# Patient Record
Sex: Female | Born: 1935 | Race: Black or African American | Hispanic: No | Marital: Single | Smoking: Never smoker
Health system: Southern US, Community
[De-identification: ages and names within clinical notes are randomized; demographics above are authoritative.]

## PROBLEM LIST (undated history)

## (undated) DIAGNOSIS — F0390 Unspecified dementia without behavioral disturbance: Secondary | ICD-10-CM

## (undated) DIAGNOSIS — J449 Chronic obstructive pulmonary disease, unspecified: Secondary | ICD-10-CM

## (undated) DIAGNOSIS — N183 Chronic kidney disease, stage 3 unspecified: Secondary | ICD-10-CM

## (undated) DIAGNOSIS — Z85118 Personal history of other malignant neoplasm of bronchus and lung: Secondary | ICD-10-CM

## (undated) DIAGNOSIS — I272 Pulmonary hypertension, unspecified: Secondary | ICD-10-CM

## (undated) DIAGNOSIS — I10 Essential (primary) hypertension: Secondary | ICD-10-CM

## (undated) HISTORY — DX: Unspecified dementia, unspecified severity, without behavioral disturbance, psychotic disturbance, mood disturbance, and anxiety: F03.90

## (undated) HISTORY — DX: Chronic kidney disease, stage 3 (moderate): N18.3

## (undated) HISTORY — PX: LOBECTOMY: SHX5089

## (undated) HISTORY — DX: Pulmonary hypertension, unspecified: I27.20

## (undated) HISTORY — DX: Chronic kidney disease, stage 3 unspecified: N18.30

## (undated) HISTORY — PX: ABDOMINAL HYSTERECTOMY: SHX81

## (undated) HISTORY — DX: Personal history of other malignant neoplasm of bronchus and lung: Z85.118

## (undated) HISTORY — DX: Chronic obstructive pulmonary disease, unspecified: J44.9

## (undated) HISTORY — DX: Essential (primary) hypertension: I10

---

## 2011-04-03 ENCOUNTER — Ambulatory Visit: Payer: Self-pay | Admitting: Oncology

## 2011-04-17 ENCOUNTER — Ambulatory Visit: Payer: Self-pay | Admitting: Oncology

## 2011-05-18 ENCOUNTER — Ambulatory Visit: Payer: Self-pay | Admitting: Oncology

## 2011-05-24 ENCOUNTER — Ambulatory Visit: Payer: Self-pay | Admitting: Cardiothoracic Surgery

## 2011-06-01 ENCOUNTER — Ambulatory Visit: Payer: Self-pay | Admitting: Oncology

## 2011-06-06 ENCOUNTER — Ambulatory Visit: Payer: Self-pay

## 2011-06-14 ENCOUNTER — Inpatient Hospital Stay: Payer: Self-pay

## 2011-06-14 DIAGNOSIS — R9431 Abnormal electrocardiogram [ECG] [EKG]: Secondary | ICD-10-CM

## 2011-06-17 ENCOUNTER — Ambulatory Visit: Payer: Self-pay | Admitting: Oncology

## 2011-06-17 ENCOUNTER — Ambulatory Visit: Payer: Self-pay | Admitting: Internal Medicine

## 2011-06-22 LAB — PATHOLOGY REPORT

## 2011-07-06 ENCOUNTER — Ambulatory Visit: Payer: Self-pay | Admitting: Oncology

## 2011-07-18 ENCOUNTER — Ambulatory Visit: Payer: Self-pay | Admitting: Oncology

## 2011-07-18 ENCOUNTER — Ambulatory Visit: Payer: Self-pay | Admitting: Internal Medicine

## 2011-08-18 ENCOUNTER — Ambulatory Visit: Payer: Self-pay | Admitting: Oncology

## 2011-10-11 ENCOUNTER — Ambulatory Visit: Payer: Self-pay | Admitting: Cardiothoracic Surgery

## 2011-10-19 ENCOUNTER — Ambulatory Visit: Payer: Self-pay | Admitting: Oncology

## 2011-11-15 ENCOUNTER — Ambulatory Visit: Payer: Self-pay | Admitting: Oncology

## 2012-03-07 ENCOUNTER — Ambulatory Visit: Payer: Self-pay | Admitting: Cardiothoracic Surgery

## 2012-03-07 ENCOUNTER — Ambulatory Visit: Payer: Self-pay | Admitting: Oncology

## 2012-03-08 ENCOUNTER — Ambulatory Visit: Payer: Self-pay | Admitting: Oncology

## 2012-06-30 IMAGING — CR DG CHEST 1V PORT
1 series · 1 of 1 positions shown · non-contrast
Comparison: none

REASON FOR EXAM: Assess for Pleural Effusion
COMMENTS:

PROCEDURE:     DXR - DXR PORTABLE CHEST SINGLE VIEW  - June 17, 2011  [DATE]
RESULT:     Comparison: 06/16/2011

[portable]
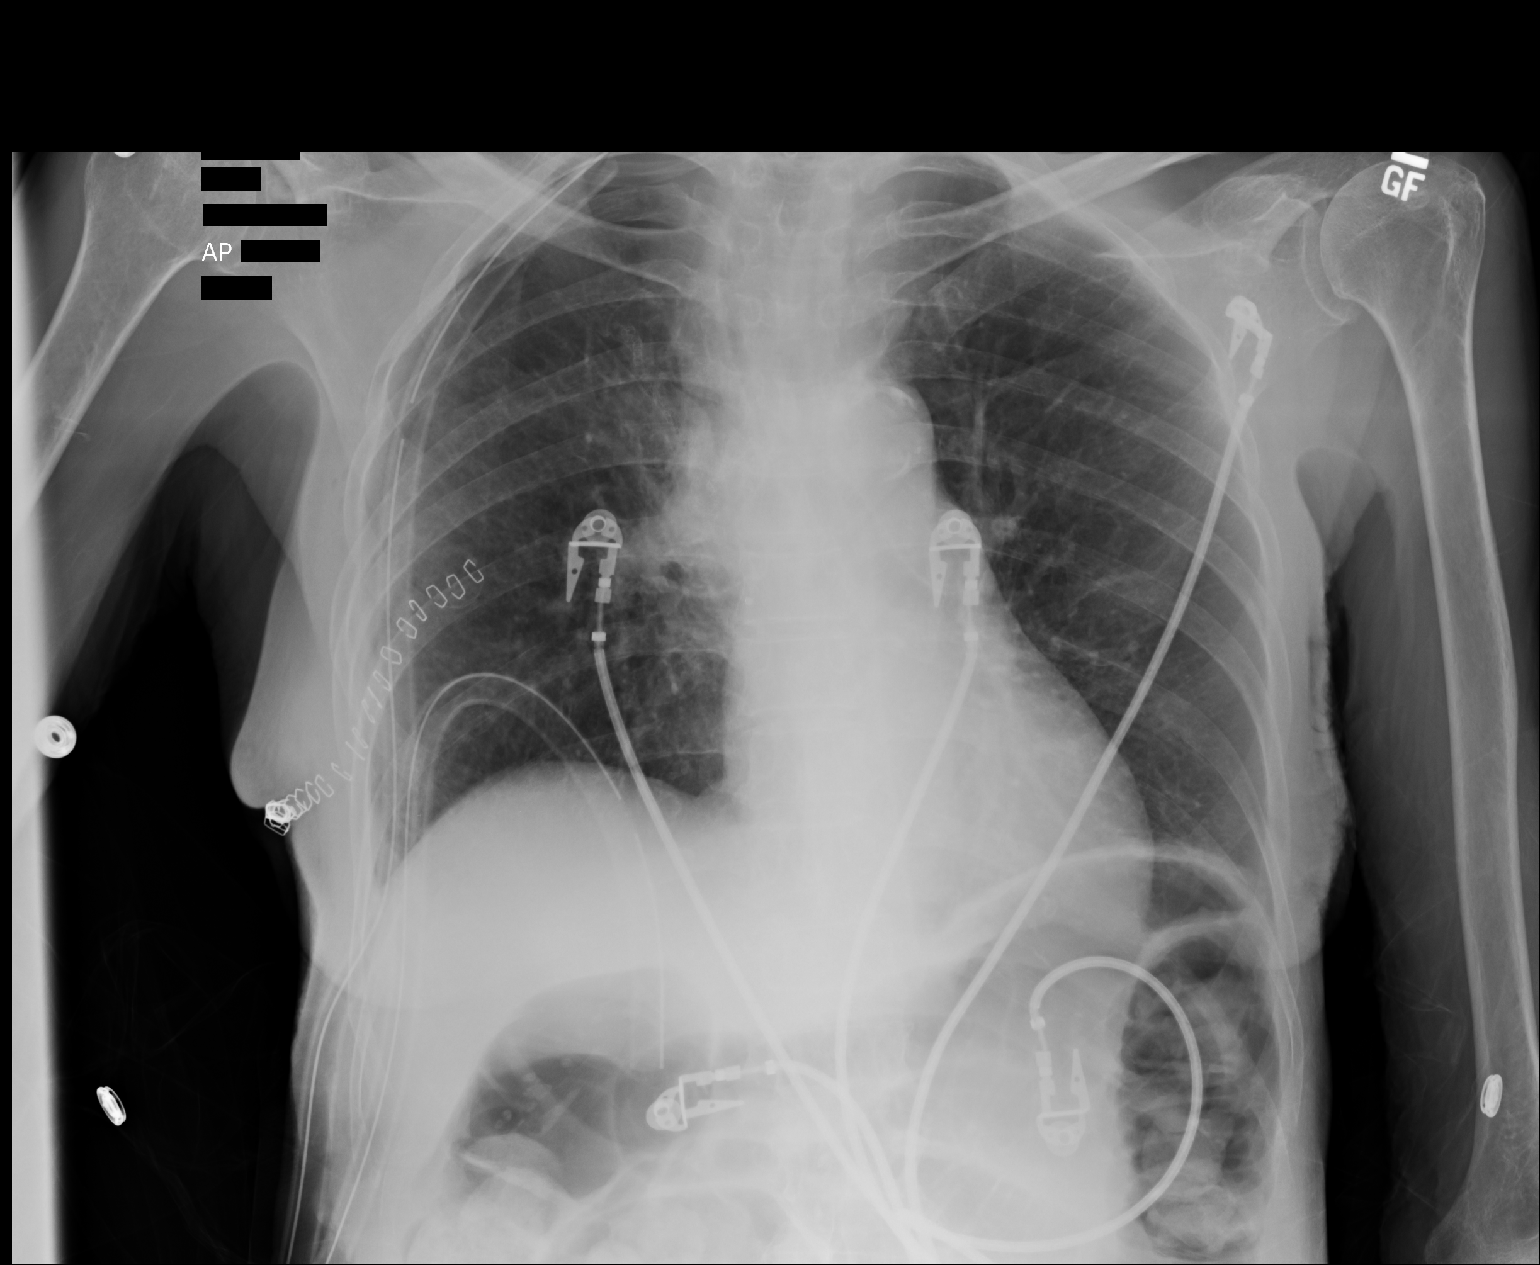

[1 of 1 positions shown; findings below may reference images not displayed]

FINDINGS: The heart and mediastinum are stable. There are 2 right chest tubes. Small
right pneumothorax is similar to prior. No definite effusion on this AP
view. No focal pulmonary opacities. Right perihilar opacities are unchanged,
possibly postoperative.
IMPRESSION: Unchanged small right pneumothorax. No definite effusion on this AP view.

## 2012-07-02 IMAGING — CR DG CHEST 1V PORT
1 series · 1 of 1 positions shown · non-contrast
Comparison: none

REASON FOR EXAM: assess for pleural effusion
COMMENTS:

[portable]
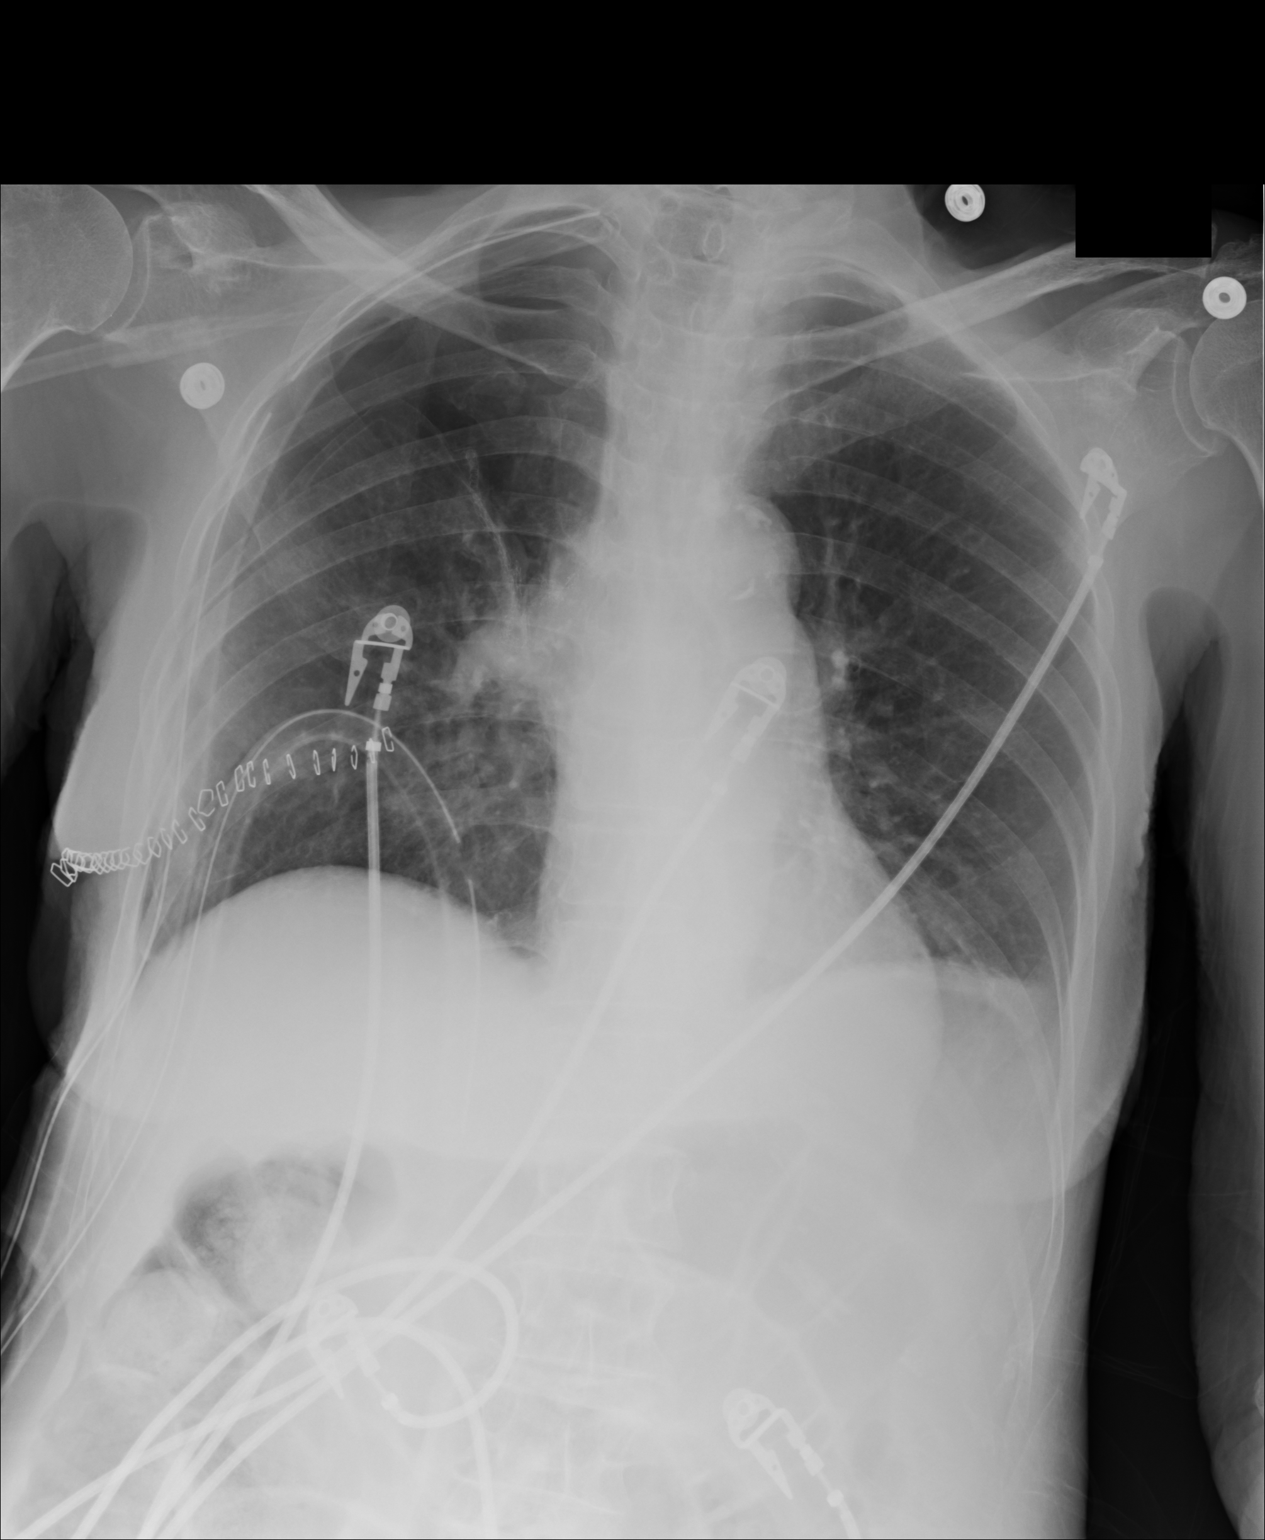

[1 of 1 positions shown; findings below may reference images not displayed]

PROCEDURE:     DXR - DXR PORTABLE CHEST SINGLE VIEW  - June 19, 2011  [DATE]

RESULT:     Comparison is made to the prior exam of 06/17/2011. Two right
chest tubes are present. The current exam shows a pneumothorax on the right
with there being approximately 2.9 cm separation of the visceral and
parietal pleura at the upper lobe. No pleural effusion is seen. The left
lung field is clear. Heart size is normal.
IMPRESSION: 1. Postoperative changes are noted on the right.
2. Two right chest tubes remain present.
3. The previously present small right pneumothorax has increased since the
prior exam.
4. No pleural effusion is seen.

## 2012-07-08 IMAGING — CR DG ABDOMEN 2V
1 series · 2 of 2 positions shown · non-contrast
Comparison: none

REASON FOR EXAM: constipation
COMMENTS:

PROCEDURE:     DXR - DXR ABDOMEN 2 V FLAT AND ERECT  - June 25, 2011  [DATE]
RESULT:
HISTORY: Constipation.
Comparison Study: Prior abdominal series of 06/20/2011.

[Series 1: erect ap · 0.17mm/px · 2 of 2 slices shown]
[im 1/2]
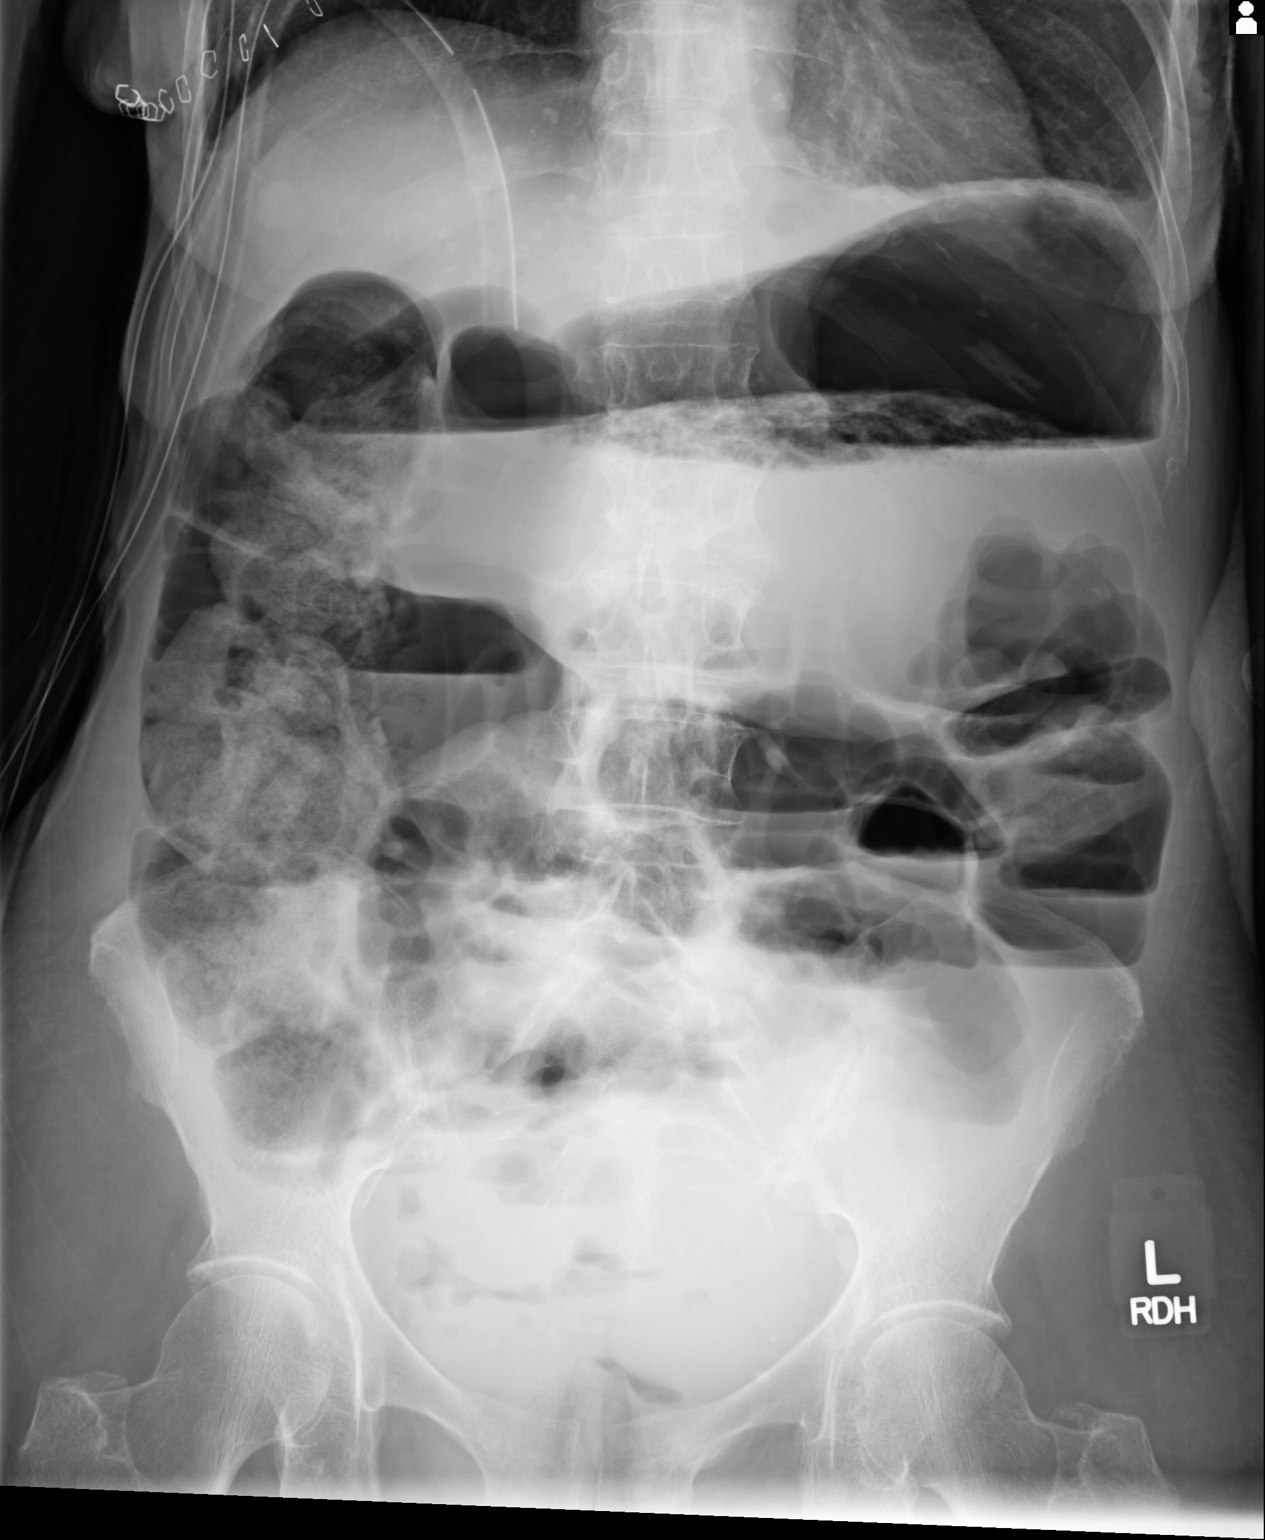
[im 2/2]
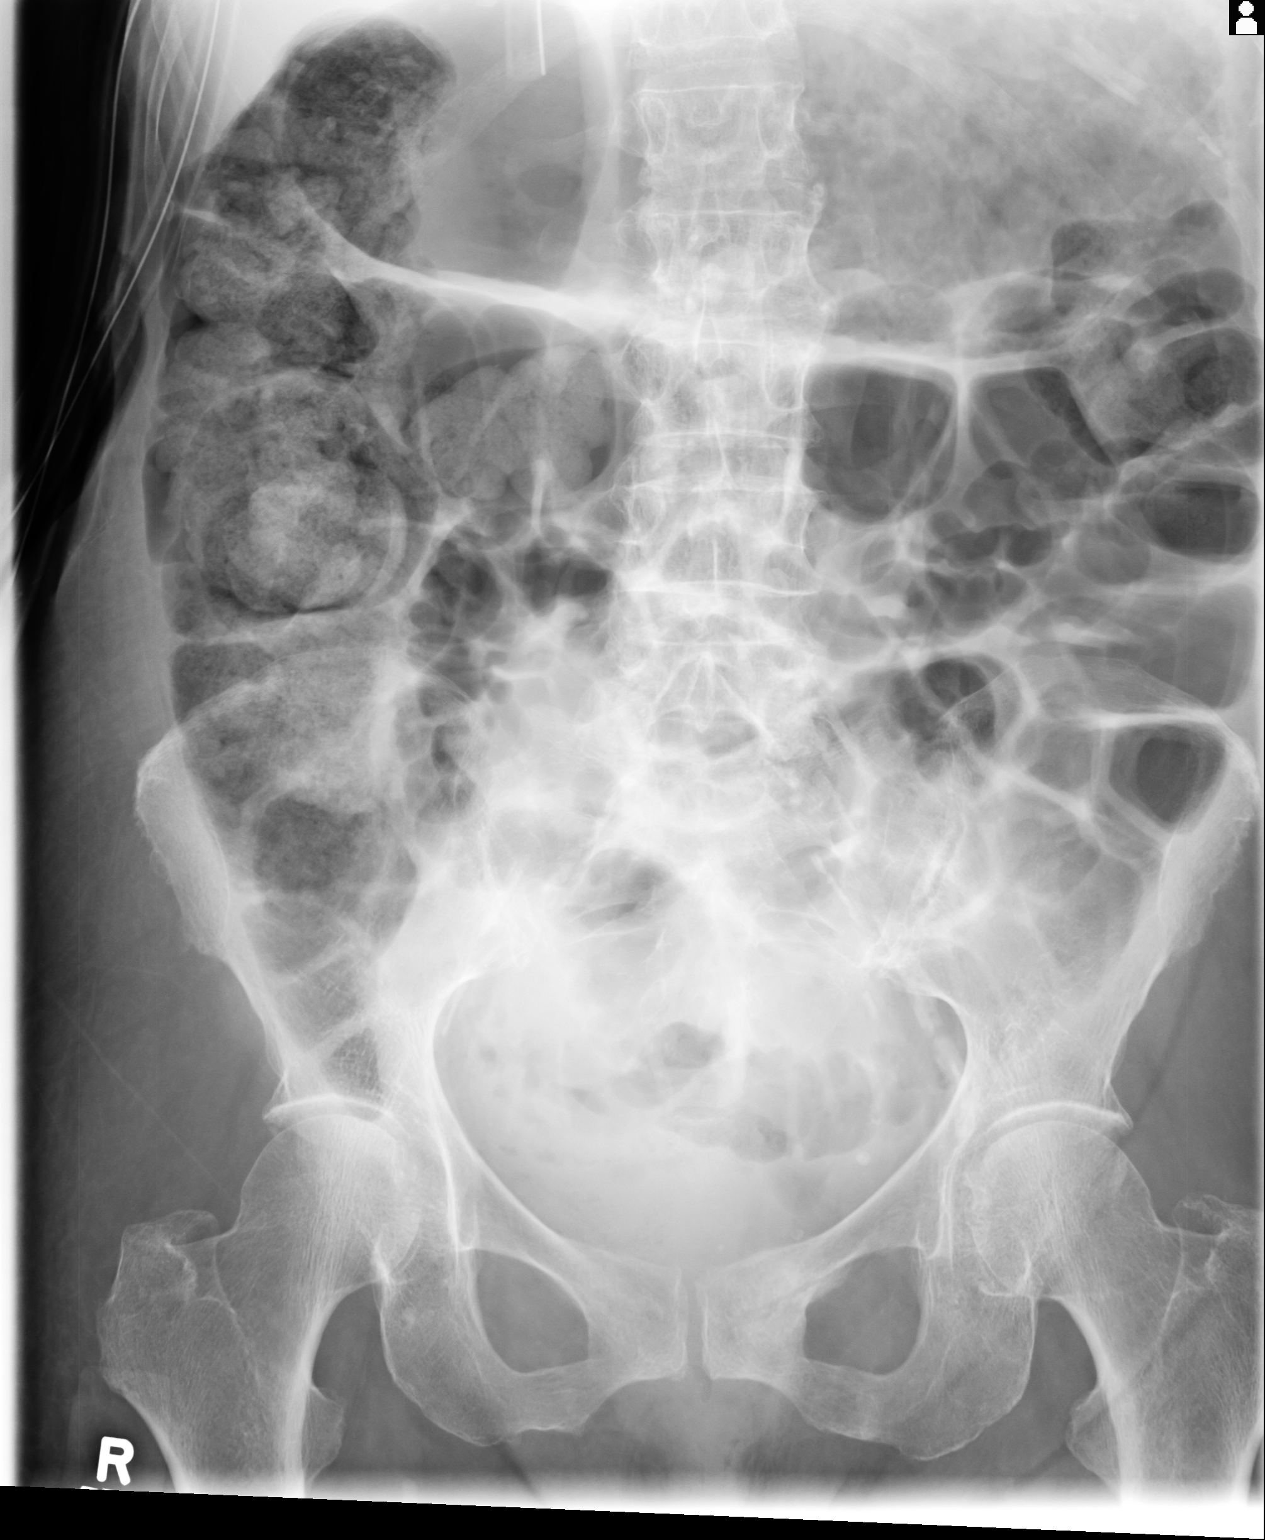

[2 of 2 positions shown; findings below may reference images not displayed]

FINDINGS: Air-filled dilated loops of large and small bowel are noted. Stool
is noted in the colon. These findings are stable and consistent with
adynamic ileus. Right chest tubes are noted. The lower chest tube is present
projected over the right lower lung. Clinical correlation is suggested.
Surgical staples are noted over the right chest.
IMPRESSION: Adynamic ileus. No change.

## 2012-07-16 IMAGING — CR DG CHEST 2V
1 series · 2 of 2 positions shown · non-contrast
Comparison: none

REASON FOR EXAM: CXT PA AND LAT LUNG CA
COMMENTS:

[Series 1: pa · 0.17mm/px · 2 of 2 slices shown]
[im 1/2]
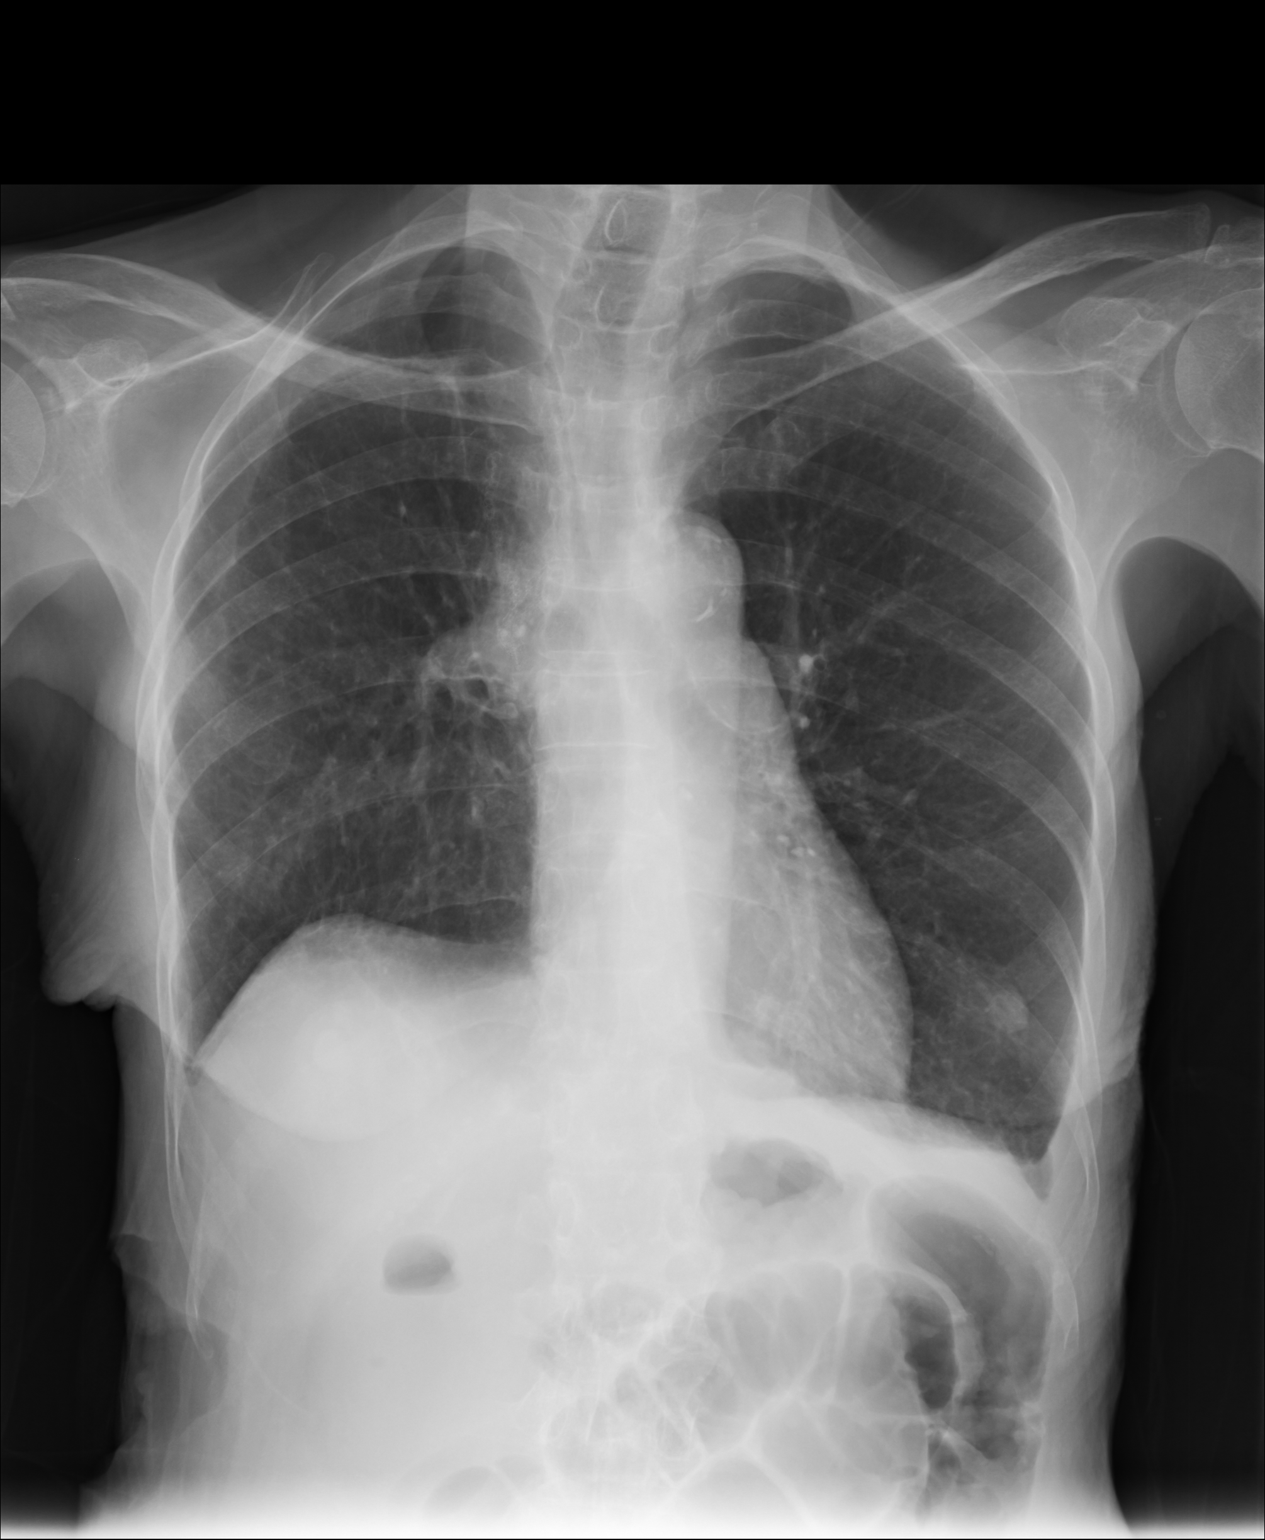
[im 2/2]
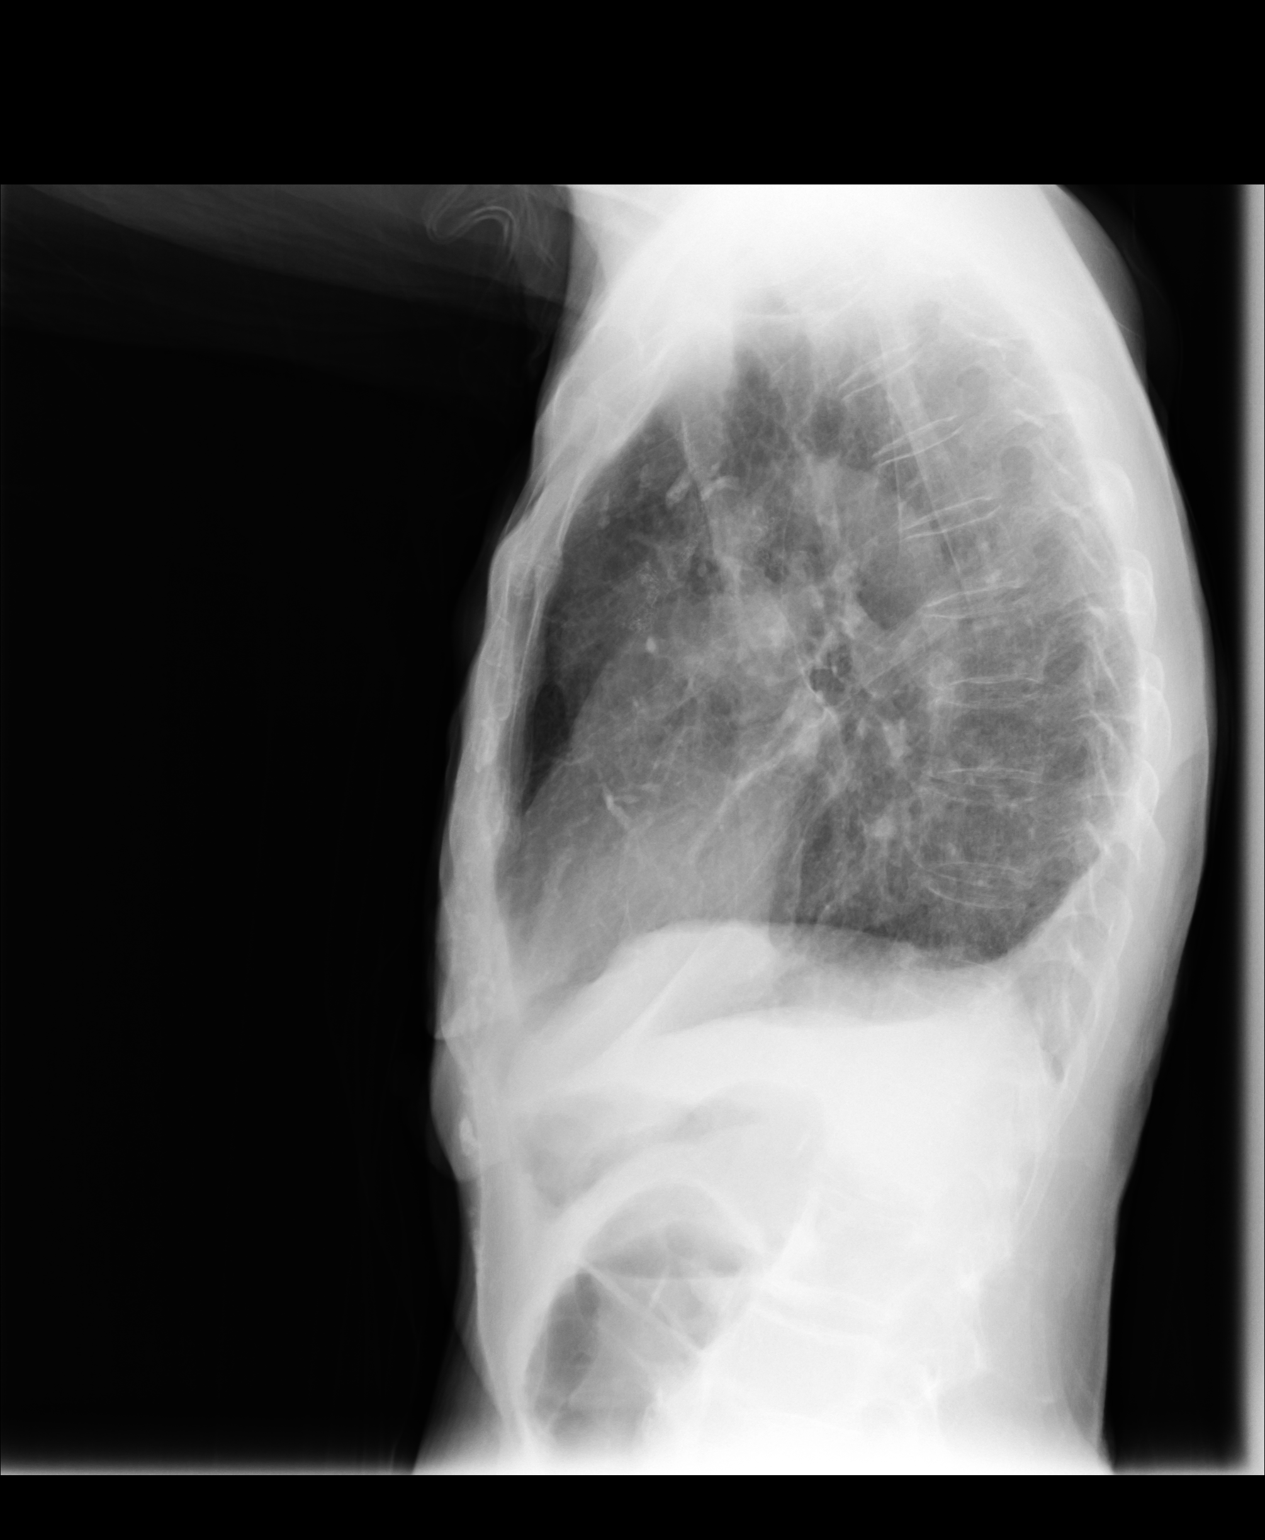

[2 of 2 positions shown; findings below may reference images not displayed]

PROCEDURE:     DXR - DXR CHEST PA (OR AP) AND LATERAL  - July 03, 2011 [DATE]

RESULT:

Comparison is made to the prior exam of 07/03/2011.

Post operative changes are again noted on the right. The previously present
chest tubes on the right have been removed. No pneumothorax is seen. There
is blunting of the left posterior gutter as visualized in the lateral view
and compatible with a small, left pleural effusion or with pleural fibrosis.
No new pulmonary infiltrates are seen. The heart size is normal.
IMPRESSION: 1.  In comparison with the prior exam of earlier this date, the previously
present two, right chest tubes have been removed. No other significant
interval change is seen.
2.  No pneumothorax is identified.

## 2012-08-17 ENCOUNTER — Ambulatory Visit: Payer: Self-pay | Admitting: Cardiothoracic Surgery

## 2012-09-14 ENCOUNTER — Ambulatory Visit: Payer: Self-pay | Admitting: Cardiothoracic Surgery

## 2013-02-28 ENCOUNTER — Ambulatory Visit: Payer: Self-pay | Admitting: Cardiothoracic Surgery

## 2013-03-06 ENCOUNTER — Ambulatory Visit: Payer: Self-pay | Admitting: Cardiothoracic Surgery

## 2013-03-17 ENCOUNTER — Ambulatory Visit: Payer: Self-pay | Admitting: Cardiothoracic Surgery

## 2013-09-03 ENCOUNTER — Ambulatory Visit: Payer: Self-pay | Admitting: Cardiothoracic Surgery

## 2013-09-24 ENCOUNTER — Ambulatory Visit: Payer: Self-pay | Admitting: Internal Medicine

## 2013-10-15 ENCOUNTER — Ambulatory Visit: Payer: Self-pay | Admitting: Internal Medicine

## 2014-06-25 ENCOUNTER — Ambulatory Visit: Payer: Self-pay | Admitting: Cardiothoracic Surgery

## 2014-07-23 ENCOUNTER — Ambulatory Visit: Payer: Self-pay | Admitting: Cardiothoracic Surgery

## 2016-12-21 ENCOUNTER — Inpatient Hospital Stay (HOSPITAL_COMMUNITY)
Admission: EM | Admit: 2016-12-21 | Discharge: 2016-12-25 | DRG: 683 | Disposition: A | Discharge status: Skilled Nursing Facility | Payer: Medicare Other | Attending: Internal Medicine | Admitting: Internal Medicine

## 2016-12-21 ENCOUNTER — Encounter (HOSPITAL_COMMUNITY): Payer: Self-pay | Admitting: Emergency Medicine

## 2016-12-21 ENCOUNTER — Emergency Department (HOSPITAL_COMMUNITY): Payer: Medicare Other

## 2016-12-21 DIAGNOSIS — R627 Adult failure to thrive: Secondary | ICD-10-CM | POA: Diagnosis present

## 2016-12-21 DIAGNOSIS — Z681 Body mass index (BMI) 19 or less, adult: Secondary | ICD-10-CM

## 2016-12-21 DIAGNOSIS — N183 Chronic kidney disease, stage 3 unspecified: Secondary | ICD-10-CM | POA: Diagnosis present

## 2016-12-21 DIAGNOSIS — E860 Dehydration: Secondary | ICD-10-CM | POA: Diagnosis present

## 2016-12-21 DIAGNOSIS — Z85118 Personal history of other malignant neoplasm of bronchus and lung: Secondary | ICD-10-CM

## 2016-12-21 DIAGNOSIS — F0280 Dementia in other diseases classified elsewhere without behavioral disturbance: Secondary | ICD-10-CM | POA: Diagnosis not present

## 2016-12-21 DIAGNOSIS — I129 Hypertensive chronic kidney disease with stage 1 through stage 4 chronic kidney disease, or unspecified chronic kidney disease: Secondary | ICD-10-CM | POA: Diagnosis present

## 2016-12-21 DIAGNOSIS — R000 Tachycardia, unspecified: Secondary | ICD-10-CM | POA: Diagnosis present

## 2016-12-21 DIAGNOSIS — R011 Cardiac murmur, unspecified: Secondary | ICD-10-CM | POA: Diagnosis present

## 2016-12-21 DIAGNOSIS — W19XXXA Unspecified fall, initial encounter: Secondary | ICD-10-CM | POA: Diagnosis present

## 2016-12-21 DIAGNOSIS — E440 Moderate protein-calorie malnutrition: Secondary | ICD-10-CM | POA: Insufficient documentation

## 2016-12-21 DIAGNOSIS — I272 Pulmonary hypertension, unspecified: Secondary | ICD-10-CM | POA: Diagnosis present

## 2016-12-21 DIAGNOSIS — F0390 Unspecified dementia without behavioral disturbance: Secondary | ICD-10-CM | POA: Diagnosis present

## 2016-12-21 DIAGNOSIS — R0602 Shortness of breath: Secondary | ICD-10-CM

## 2016-12-21 DIAGNOSIS — N179 Acute kidney failure, unspecified: Secondary | ICD-10-CM | POA: Diagnosis present

## 2016-12-21 DIAGNOSIS — I10 Essential (primary) hypertension: Secondary | ICD-10-CM | POA: Diagnosis not present

## 2016-12-21 DIAGNOSIS — Y92009 Unspecified place in unspecified non-institutional (private) residence as the place of occurrence of the external cause: Secondary | ICD-10-CM | POA: Diagnosis not present

## 2016-12-21 DIAGNOSIS — S32591A Other specified fracture of right pubis, initial encounter for closed fracture: Secondary | ICD-10-CM | POA: Diagnosis present

## 2016-12-21 DIAGNOSIS — Z79899 Other long term (current) drug therapy: Secondary | ICD-10-CM | POA: Diagnosis not present

## 2016-12-21 DIAGNOSIS — R600 Localized edema: Secondary | ICD-10-CM | POA: Diagnosis present

## 2016-12-21 DIAGNOSIS — G309 Alzheimer's disease, unspecified: Secondary | ICD-10-CM | POA: Diagnosis not present

## 2016-12-21 DIAGNOSIS — D649 Anemia, unspecified: Secondary | ICD-10-CM | POA: Diagnosis present

## 2016-12-21 DIAGNOSIS — I340 Nonrheumatic mitral (valve) insufficiency: Secondary | ICD-10-CM | POA: Diagnosis not present

## 2016-12-21 DIAGNOSIS — R609 Edema, unspecified: Secondary | ICD-10-CM

## 2016-12-21 DIAGNOSIS — M25559 Pain in unspecified hip: Secondary | ICD-10-CM

## 2016-12-21 LAB — URINALYSIS, ROUTINE W REFLEX MICROSCOPIC
BILIRUBIN URINE: NEGATIVE
Glucose, UA: 50 mg/dL — AB
Ketones, ur: 5 mg/dL — AB
Nitrite: NEGATIVE
Protein, ur: 100 mg/dL — AB
SPECIFIC GRAVITY, URINE: 1.013 (ref 1.005–1.030)
Squamous Epithelial / LPF: NONE SEEN
pH: 5 (ref 5.0–8.0)

## 2016-12-21 LAB — BASIC METABOLIC PANEL
Anion gap: 21 — ABNORMAL HIGH (ref 5–15)
BUN: 42 mg/dL — ABNORMAL HIGH (ref 6–20)
CHLORIDE: 97 mmol/L — AB (ref 101–111)
CO2: 16 mmol/L — ABNORMAL LOW (ref 22–32)
CREATININE: 3.12 mg/dL — AB (ref 0.44–1.00)
Calcium: 9.5 mg/dL (ref 8.9–10.3)
GFR calc Af Amer: 15 mL/min — ABNORMAL LOW (ref >60)
GFR, EST NON AFRICAN AMERICAN: 13 mL/min — AB (ref >60)
Glucose, Bld: 147 mg/dL — ABNORMAL HIGH (ref 65–99)
Potassium: 4.1 mmol/L (ref 3.5–5.1)
Sodium: 134 mmol/L — ABNORMAL LOW (ref 135–145)

## 2016-12-21 LAB — CBC WITH DIFFERENTIAL/PLATELET
BASOS PCT: 0 %
Basophils Absolute: 0 10*3/uL (ref 0.0–0.1)
EOS ABS: 0 10*3/uL (ref 0.0–0.7)
Eosinophils Relative: 0 %
HCT: 29.5 % — ABNORMAL LOW (ref 36.0–46.0)
HEMOGLOBIN: 10 g/dL — AB (ref 12.0–15.0)
Lymphocytes Relative: 4 %
Lymphs Abs: 0.6 10*3/uL — ABNORMAL LOW (ref 0.7–4.0)
MCH: 27.9 pg (ref 26.0–34.0)
MCHC: 33.9 g/dL (ref 30.0–36.0)
MCV: 82.2 fL (ref 78.0–100.0)
Monocytes Absolute: 0.8 10*3/uL (ref 0.1–1.0)
Monocytes Relative: 6 %
NEUTROS PCT: 90 %
Neutro Abs: 12 10*3/uL — ABNORMAL HIGH (ref 1.7–7.7)
PLATELETS: 314 10*3/uL (ref 150–400)
RBC: 3.59 MIL/uL — AB (ref 3.87–5.11)
RDW: 17.2 % — ABNORMAL HIGH (ref 11.5–15.5)
WBC: 13.3 10*3/uL — AB (ref 4.0–10.5)

## 2016-12-21 LAB — BRAIN NATRIURETIC PEPTIDE: B Natriuretic Peptide: 113 pg/mL — ABNORMAL HIGH (ref 0.0–100.0)

## 2016-12-21 LAB — TSH: TSH: 0.912 u[IU]/mL (ref 0.350–4.500)

## 2016-12-21 MED ORDER — HEPARIN SODIUM (PORCINE) 5000 UNIT/ML IJ SOLN
5000.0000 [IU] | Freq: Three times a day (TID) | INTRAMUSCULAR | Status: DC
  Administered 2016-12-21 – 2016-12-25 (×9): 5000 [IU] via SUBCUTANEOUS
  Filled 2016-12-21 (×9): qty 1

## 2016-12-21 MED ORDER — ONDANSETRON HCL 4 MG PO TABS: 4.0000 mg | ORAL_TABLET | Freq: Four times a day (QID) | ORAL | Status: DC | PRN

## 2016-12-21 MED ORDER — SODIUM CHLORIDE 0.9 % IV SOLN
INTRAVENOUS | Status: DC
  Administered 2016-12-21 – 2016-12-25 (×8): via INTRAVENOUS

## 2016-12-21 MED ORDER — ASPIRIN EC 81 MG PO TBEC
81.0000 mg | DELAYED_RELEASE_TABLET | Freq: Every day | ORAL | Status: DC
  Administered 2016-12-21 – 2016-12-25 (×5): 81 mg via ORAL
  Filled 2016-12-21 (×5): qty 1

## 2016-12-21 MED ORDER — ENSURE ENLIVE PO LIQD
237.0000 mL | Freq: Two times a day (BID) | ORAL | Status: DC
  Administered 2016-12-22 – 2016-12-25 (×7): 237 mL via ORAL

## 2016-12-21 MED ORDER — ONDANSETRON HCL 4 MG/2ML IJ SOLN: 4.0000 mg | Freq: Four times a day (QID) | INTRAMUSCULAR | Status: DC | PRN

## 2016-12-21 MED ORDER — SODIUM CHLORIDE 0.9 % IV BOLUS (SEPSIS)
1000.0000 mL | Freq: Once | INTRAVENOUS | Status: AC
  Administered 2016-12-21: 1000 mL via INTRAVENOUS

## 2016-12-21 NOTE — Progress Notes (Signed)
CMT notified me that the patient had a run of SVT with a HR of 140.  It is currently 110.  Notified Dr. Marin Comment via text.  The patient appeared to be stable and voiced no concerns at the time of the incident.

## 2016-12-21 NOTE — H&P (Signed)
History and Physical    Kimberly Vincent EGB:[id number] DOB: [birth year] DOA: 12/21/2016  PCP: [facility]  Patient coming from: Home.    Chief Complaint:  Weakness and swelling of the lower extremities.   HPI: Kimberly Vincent is an 83 y.o. female with hx of dementia (mild), CKD unclear baseline Cr, HTN on Norvasc, hx of lung cancer s/p resection, lives with daughter at home, brought to the ER after she fell.   She also had failure to thrive, and hadn't been eating or drinking as much.  She has some low back pain, but no palpitation, CP, SOB, fever or chills.  Evaluation in the ER  Showed elevated BUN/Cr to 42/3.12, CXR showed bilateral pleural effusions but no infiltrate or vascular congestion, and doppler of the lower extremities showed no DVT.  Hospitalist was asked to admit her for bilateral pedal edema, AKI on CKD likely from volume depletion, and failure to thrive, weakness and falling.    ED Course:  See above.  Rewiew of Systems:  Constitutional: Negative for malaise, fever and chills. No significant weight loss or weight gain Eyes: Negative for eye pain, redness and discharge, diplopia, visual changes, or flashes of light. ENMT: Negative for ear pain, hoarseness, nasal congestion, sinus pressure and sore throat. No headaches; tinnitus, drooling, or problem swallowing. Cardiovascular: Negative for chest pain, palpitations, diaphoresis, dyspnea and peripheral edema. ; No orthopnea, PND Respiratory: Negative for cough, hemoptysis, wheezing and stridor. No pleuritic chestpain. Gastrointestinal: Negative for diarrhea, constipation,  melena, blood in stool, hematemesis, jaundice and rectal bleeding.    Genitourinary: Negative for frequency, dysuria, incontinence,flank pain and hematuria; Musculoskeletal: Negative for back pain and neck pain. Negative for swelling and trauma.;  Skin: . Negative for pruritus, rash, abrasions, bruising and skin lesion.; ulcerations Neuro:  Negative for headache, lightheadedness and neck stiffness. Negative for  altered level of consciousness , altered mental status,  burning feet, involuntary movement, seizure and syncope.  Psych: negative for  insomnia, tearfulness, panic attacks, hallucinations, paranoia, suicidal or homicidal ideation    Past Medical History:  Diagnosis Date  . Chronic kidney disease   . Hypertension   . Lung cancer [facility])       Past Surgical History:  Procedure Laterality Date  . ABDOMINAL HYSTERECTOMY    . LOBECTOMY       reports that she has never smoked. She has never used smokeless tobacco. She reports that she does not drink alcohol or use drugs.  No Known Allergies  History reviewed. No pertinent family history.   Prior to Admission medications   Medication Sig Start Date End Date Taking? Authorizing Provider  amLODipine (NORVASC) 10 MG tablet Take 1 tablet by mouth daily. 11/16/16  Yes [provider]  niacin (NIASPAN) 750 MG CR tablet Take 1 tablet by mouth at bedtime. 09/27/16  Yes [provider]    Physical Exam: Vitals:   12/21/16 0928 12/21/16 1200 12/21/16 1201 12/21/16 1428  BP: (!) 138/49 (!) 143/69    Pulse: (!) 105  (!) 112 (!) 107  Resp: (!) 22   18  Temp:      TempSrc:      SpO2: 99%  100% 100%  Weight:      Height:          Constitutional: NAD, calm, comfortable Vitals:   12/21/16 0928 12/21/16 1200 12/21/16 1201 12/21/16 1428  BP: (!) 138/49 (!) 143/69    Pulse: (!) 105  (!) 112 (!) 107  Resp: (!) 22   18  Temp:      TempSrc:      SpO2: 99%  100% 100%  Weight:      Height:       Eyes: PERRL, lids and conjunctivae normal ENMT: Mucous membranes are moist. Posterior pharynx clear of any exudate or lesions.Normal dentition.  Neck: normal, supple, no masses, no thyromegaly Respiratory: clear to auscultation bilaterally, no wheezing, no crackles. Normal respiratory effort. No accessory muscle use.  Cardiovascular: Regular rate and rhythm,  no murmurs / rubs / gallops. No extremity edema. 2+ pedal pulses. No carotid bruits.  Abdomen: no tenderness, no masses palpated. No hepatosplenomegaly. Bowel sounds positive.  Musculoskeletal: no clubbing / cyanosis. No joint deformity upper and lower extremities. Good ROM, no contractures. Normal muscle tone. 1-2 + edema.  Skin: no rashes, lesions, ulcers. No induration Neurologic: CN 2-12 grossly intact. Sensation intact, DTR normal. Strength 5/5 in all 4.  Psychiatric: Normal judgment and insight. Alert and oriented x 3. Normal mood.   Labs on Admission: I have personally reviewed following labs and imaging studies CBC:  Recent Labs Lab 12/21/16 1026  WBC 13.3*  NEUTROABS 12.0*  HGB 10.0*  HCT 29.5*  MCV 82.2  PLT 867   Basic Metabolic Panel:  Recent Labs Lab 12/21/16 1026  NA 134*  K 4.1  CL 97*  CO2 16*  GLUCOSE 147*  BUN 42*  CREATININE 3.12*  CALCIUM 9.5   Urine analysis:    Component Value Date/Time   COLORURINE YELLOW 12/21/2016 1355   APPEARANCEUR CLOUDY (A) 12/21/2016 1355   LABSPEC 1.013 12/21/2016 1355   PHURINE 5.0 12/21/2016 1355   GLUCOSEU 50 (A) 12/21/2016 1355   HGBUR MODERATE (A) 12/21/2016 1355   BILIRUBINUR NEGATIVE 12/21/2016 1355   KETONESUR 5 (A) 12/21/2016 1355   PROTEINUR 100 (A) 12/21/2016 1355   NITRITE NEGATIVE 12/21/2016 1355   LEUKOCYTESUR SMALL (A) 12/21/2016 1355   Radiological Exams on Admission: Dg Chest 1 View  Result Date: 12/21/2016 CLINICAL DATA:  Shortness of breath. EXAM: CHEST 1 VIEW COMPARISON:  06/25/2014 FINDINGS: Small pleural effusions. Chronic scar-like opacity over the right base. Status post right upper lobectomy. No air bronchogram or interstitial coarsening. Normal heart size. Stable mediastinal contours with postoperative distortion on the right. Prominent atherosclerotic calcification. IMPRESSION: 1. Small bilateral pleural effusion.  No visible edema or pneumonia. 2. COPD and right upper lobectomy.  Electronically Signed   By: Monte Fantasia M.D.   On: 12/21/2016 10:01   US Venous Img Lower Bilateral  Result Date: 12/21/2016 CLINICAL DATA:  Bilateral lower extremity edema EXAM: BILATERAL LOWER EXTREMITY VENOUS DUPLEX ULTRASOUND TECHNIQUE: Gray-scale sonography with graded compression, as well as color Doppler and duplex ultrasound were performed to evaluate the lower extremity deep venous systems from the level of the common femoral vein and including the common femoral, femoral, profunda femoral, popliteal and calf veins including the posterior tibial, peroneal and gastrocnemius veins when visible. The superficial great saphenous vein was also interrogated. Spectral Doppler was utilized to evaluate flow at rest and with distal augmentation maneuvers in the common femoral, femoral and popliteal veins. COMPARISON:  None. FINDINGS: RIGHT LOWER EXTREMITY Common Femoral Vein: No evidence of thrombus. Normal compressibility, respiratory phasicity and response to augmentation. Saphenofemoral Junction: No evidence of thrombus. Normal compressibility and flow on color Doppler imaging. Profunda Femoral Vein: No evidence of thrombus. Normal compressibility and flow on color Doppler imaging. Femoral Vein: No evidence of thrombus. Normal compressibility, respiratory phasicity and response to  augmentation. Popliteal Vein: No evidence of thrombus. Normal compressibility, respiratory phasicity and response to augmentation. Calf Veins: No evidence of thrombus. Normal compressibility and flow on color Doppler imaging. Superficial Great Saphenous Vein: No evidence of thrombus. Normal compressibility and flow on color Doppler imaging. Venous Reflux:  None. Other Findings:  None. LEFT LOWER EXTREMITY Common Femoral Vein: No evidence of thrombus. Normal compressibility, respiratory phasicity and response to augmentation. Saphenofemoral Junction: No evidence of thrombus. Normal compressibility and flow on color Doppler imaging.  Profunda Femoral Vein: No evidence of thrombus. Normal compressibility and flow on color Doppler imaging. Femoral Vein: No evidence of thrombus. Normal compressibility, respiratory phasicity and response to augmentation. Popliteal Vein: No evidence of thrombus. Normal compressibility, respiratory phasicity and response to augmentation. Calf Veins: No evidence of thrombus. Normal compressibility and flow on color Doppler imaging. Superficial Great Saphenous Vein: No evidence of thrombus. Normal compressibility and flow on color Doppler imaging. Venous Reflux:  None. Other Findings:  There is soft tissue edema bilaterally. IMPRESSION: No evidence of deep venous thrombosis in either lower extremity. There is soft tissue edema in both lower extremities. Electronically Signed   By: Lowella Grip III M.D.   On: 12/21/2016 13:27    EKG: Independently reviewed.   Assessment/Plan Active Problems:   Failure to thrive in adult   AKI (acute kidney injury) ([place])   Pedal edema   HTN (hypertension)   Dementia   CKD (chronic kidney disease), stage III   Tachycardia   Fall at home, initial encounter   PLAN:   AKI on CKD:  She is clinically volume depleted.  Will give IVF.  Check UA.  Will obtain renal US.  Follow Cr carefully.   Pedal edema:  I don't think she has volume overload.  No DVT.  Suspect dependent edema, aggravated with Norvasc.  Will d/c Norvasc.  If BP is up, consider Lopressor.   Dementia:  Very mild.  She can still make informed decision in my opinion.  HTN:  Stable.  WIll follow.  Change norvasc to Lopressor to avoid edema.  Tachycardia:  Unclear if pain, anxiety, or volume depletion.  Will follow.  Check TSH and check ECHO.  Heart murmur:  Obtain ECHO.   DVT prophylaxis:  subQ heparin.  Code Status: FULL CODE.  Family Communication: daughter and friend at bedside.  Disposition Plan: likely to home.  Consults called: None.  Admission status: inpatient.    Hana Trippett MD  FACP. [facility]  If 7PM-7AM, please contact night-coverage [email or web address] Password [email or web address]  12/21/2016, 3:12 PM

## 2016-12-21 NOTE — ED Provider Notes (Signed)
[facility] DEPT Provider Note   CSN: [id number] Arrival date & time: 12/21/16  0913     History   Chief Complaint Chief Complaint  Patient presents with  . Leg Swelling    HPI Kimberly Vincent is a 83 y.o. female.  HPI  The patient is a 83 year old female, she has a known history of lung cancer, chronic kidney disease and hypertension, she is currently taking Norvasc and niacin. She presents to the hospital with her family member who reports that she has had increased swelling of her legs, increased swelling of her left arm, has been having some confusion chronically and lives by herself. There is no comment about when she was most recently seen or about recent results.  There has been no reported fevers diarrhea or dysuria, no rashes, no abdominal pain chest pain coughing or shortness of breath. There is a report of decreased oral intake chronically. The patient states she just doesn't have any appetite.  The family member does report some chronic mild dementia which is gradually worsening  Past Medical History:  Diagnosis Date  . Chronic kidney disease   . Hypertension   . Lung cancer ([place])     There are no active problems to display for this patient.   Past Surgical History:  Procedure Laterality Date  . ABDOMINAL HYSTERECTOMY    . LOBECTOMY      OB History    No data available       Home Medications    Prior to Admission medications   Not on File    Family History History reviewed. No pertinent family history.  Social History Social History  Substance Use Topics  . Smoking status: Never Smoker  . Smokeless tobacco: Never Used  . Alcohol use No     Allergies   Patient has no known allergies.   Review of Systems Review of Systems  Unable to perform ROS: Dementia     Physical Exam Updated Vital Signs BP (!) 143/69   Pulse (!) 112   Temp 97.7 F (36.5 C) (Oral)   Resp (!) 22   Ht 5\' 3"  (1.6 m)   Wt 37.2 kg (82 lb)   SpO2 100%   BMI  14.53 kg/m   Physical Exam  Constitutional: She appears well-developed and well-nourished. No distress.  HENT:  Head: Normocephalic and atraumatic.  Mouth/Throat: Oropharynx is clear and moist. No oropharyngeal exudate.  Eyes: Conjunctivae and EOM are normal. Pupils are equal, round, and reactive to light. Right eye exhibits no discharge. Left eye exhibits no discharge. No scleral icterus.  Neck: Normal range of motion. Neck supple. No JVD present. No thyromegaly present.  Cardiovascular: Regular rhythm, normal heart sounds and intact distal pulses.  Exam reveals no gallop and no friction rub.   No murmur heard. Tachycardic, normal pulses, no JVD, no murmurs  Pulmonary/Chest: Effort normal and breath sounds normal. No respiratory distress. She has no wheezes. She has no rales.  Abdominal: Soft. Bowel sounds are normal. She exhibits no distension and no mass. There is no tenderness.  Musculoskeletal: Normal range of motion. She exhibits edema ( 2+ pitting edema of the right lower extremity, 1+ of the left lower extremity, left upper extremity with 1+ edema at the hand but otherwise symmetrical arms.). She exhibits no tenderness.  Lymphadenopathy:    She has no cervical adenopathy.  Neurological: She is alert. Coordination normal.  Follows commands, generalized weakness, able to lift both arms, both legs and cranial nerves III through  XII are normal. Memory is somewhat lacking but able to answer occasional questions correctly  Skin: Skin is warm and dry. No rash noted. No erythema.  Psychiatric: She has a normal mood and affect. Her behavior is normal.  Nursing note and vitals reviewed.    ED Treatments / Results  Labs (all labs ordered are listed, but only abnormal results are displayed) Labs Reviewed  CBC WITH DIFFERENTIAL/PLATELET - Abnormal; Notable for the following:       Result Value   WBC 13.3 (*)    RBC 3.59 (*)    Hemoglobin 10.0 (*)    HCT 29.5 (*)    RDW 17.2 (*)     Neutro Abs 12.0 (*)    Lymphs Abs 0.6 (*)    All other components within normal limits  BASIC METABOLIC PANEL - Abnormal; Notable for the following:    Sodium 134 (*)    Chloride 97 (*)    CO2 16 (*)    Glucose, Bld 147 (*)    BUN 42 (*)    Creatinine, Ser 3.12 (*)    GFR calc non Af Amer 13 (*)    GFR calc Af Amer 15 (*)    Anion gap 21 (*)    All other components within normal limits  BRAIN NATRIURETIC PEPTIDE - Abnormal; Notable for the following:    B Natriuretic Peptide 113.0 (*)    All other components within normal limits  URINALYSIS, ROUTINE W REFLEX MICROSCOPIC    EKG  EKG Interpretation None       Radiology Dg Chest 1 View  Result Date: 12/21/2016 CLINICAL DATA:  Shortness of breath. EXAM: CHEST 1 VIEW COMPARISON:  06/25/2014 FINDINGS: Small pleural effusions. Chronic scar-like opacity over the right base. Status post right upper lobectomy. No air bronchogram or interstitial coarsening. Normal heart size. Stable mediastinal contours with postoperative distortion on the right. Prominent atherosclerotic calcification. IMPRESSION: 1. Small bilateral pleural effusion.  No visible edema or pneumonia. 2. COPD and right upper lobectomy. Electronically Signed   By: Monte Fantasia M.D.   On: 12/21/2016 10:01    Procedures Procedures (including critical care time)  Medications Ordered in ED Medications  sodium chloride 0.9 % bolus 1,000 mL (not administered)     Initial Impression / Assessment and Plan / ED Course  I have reviewed the triage vital signs and the nursing notes.  Pertinent labs & imaging results that were available during my care of the patient were reviewed by me and considered in my medical decision making (see chart for details).    The patient does have findings of COPD and a right upper lobectomy. There is no edema, there is no pneumonia, there is no pneumothorax, there are small bilateral effusions. Blood work shows that the patient has renal  failure with a creatinine of close to 3, leukocytosis of 13.3 and a hemoglobin of 10. CO2 is 16, electrolytes are otherwise normal or close to normal. BNP is normal at 113. We'll try to obtain past results from her family doctor's office. Norvasc could be a cause of the peripheral edema but due to the asymmetry we'll perform a lower extremity Doppler.  09/21/16, labs reviewed from clinic - has cr of 1.6 at that time, today the BUN and Cr have doubled In AKI - needs admission, fluids, in and out for urine eval for UTI Admit to hospitalist - paged  Final Clinical Impressions(s) / ED Diagnoses   Final diagnoses:  Swelling  Peripheral edema  AKI (acute kidney injury) ([place])  Dehydration    New Prescriptions New Prescriptions   No medications on file     Noemi Chapel, MD 12/21/16 1325

## 2016-12-21 NOTE — ED Triage Notes (Addendum)
Patient brought in by family with complaint of bilateral leg swelling, and lower back pain since yesterday. Patient states "I fell, I think it was yesterday. I just lost my footing. I'm not hurting anywhere from that."

## 2016-12-21 NOTE — ED Notes (Signed)
Pt has pain to rt hip upper leg area when rolling pt in bed.  Reported to hospitalist.

## 2016-12-22 ENCOUNTER — Inpatient Hospital Stay (HOSPITAL_COMMUNITY): Payer: Medicare Other

## 2016-12-22 DIAGNOSIS — I10 Essential (primary) hypertension: Secondary | ICD-10-CM

## 2016-12-22 DIAGNOSIS — F0280 Dementia in other diseases classified elsewhere without behavioral disturbance: Secondary | ICD-10-CM

## 2016-12-22 DIAGNOSIS — W19XXXA Unspecified fall, initial encounter: Secondary | ICD-10-CM

## 2016-12-22 DIAGNOSIS — Y92009 Unspecified place in unspecified non-institutional (private) residence as the place of occurrence of the external cause: Secondary | ICD-10-CM

## 2016-12-22 DIAGNOSIS — G309 Alzheimer's disease, unspecified: Secondary | ICD-10-CM

## 2016-12-22 DIAGNOSIS — I340 Nonrheumatic mitral (valve) insufficiency: Secondary | ICD-10-CM

## 2016-12-22 LAB — ECHOCARDIOGRAM COMPLETE
AO mean calculated velocity dopler: 161 cm/s
AV Area VTI index: 1.03 cm2/m2
AV Area VTI: 1.35 cm2
AV Area mean vel: 1.32 cm2
AV Mean grad: 13 mmHg
AV Peak grad: 24 mmHg
AV VEL mean LVOT/AV: 0.66
AV area mean vel ind: 0.99 cm2/m2
AV peak Index: 1.01
AV pk vel: 246 cm/s
AV vel: 1.38
Ao pk vel: 0.67 m/s
E decel time: 345 ms
E/e' ratio: 11.03
FS: 52 % — AB (ref 28–44)
Height: 63 in
IVS/LV PW RATIO, ED: 0.87
LA ID, A-P, ES: 31 mm
LA diam end sys: 31 mm
LA diam index: 2.31 cm/m2
LA vol A4C: 35.3 mL
LA vol index: 18.3 mL/m2
LA vol: 24.6 mL
LV E/e' medial: 11.03
LV E/e'average: 11.03
LV PW d: 11.2 mm — AB (ref 0.6–1.1)
LV dias vol index: 30 mL/m2
LV dias vol: 40 mL — AB (ref 46–106)
LV e' LATERAL: 8.7 cm/s
LV sys vol index: 8 mL/m2
LV sys vol: 11 mL — AB (ref 14–42)
LVOT SV: 62 mL
LVOT VTI: 30.7 cm
LVOT area: 2.01 cm2
LVOT diameter: 16 mm
LVOT peak VTI: 0.69 cm
LVOT peak grad rest: 11 mmHg
LVOT peak vel: 165 cm/s
Lateral S' vel: 19 cm/s
MV Dec: 345
MV Peak grad: 4 mmHg
MV pk A vel: 133 m/s
MV pk E vel: 96 m/s
RV sys press: 73 mmHg
Reg peak vel: 404 cm/s
Simpson's disk: 74
Stroke v: 30 mL
TAPSE: 22.6 mm
TDI e' lateral: 8.7
TDI e' medial: 7.72
TR max vel: 404 cm/s
VTI: 44.6 cm
Valve area index: 1.03
Valve area: 1.38 cm2
Weight: 1456.8 [oz_av]

## 2016-12-22 LAB — IRON AND TIBC
Iron: 24 ug/dL — ABNORMAL LOW (ref 28–170)
Saturation Ratios: 12 % (ref 10.4–31.8)
TIBC: 203 ug/dL — ABNORMAL LOW (ref 250–450)
UIBC: 179 ug/dL

## 2016-12-22 LAB — RETICULOCYTES
RBC.: 3.26 MIL/uL — AB (ref 3.87–5.11)
RETIC COUNT ABSOLUTE: 61.9 10*3/uL (ref 19.0–186.0)
Retic Ct Pct: 1.9 % (ref 0.4–3.1)

## 2016-12-22 LAB — COMPREHENSIVE METABOLIC PANEL
ALBUMIN: 2.9 g/dL — AB (ref 3.5–5.0)
ALT: 23 U/L (ref 14–54)
AST: 27 U/L (ref 15–41)
Alkaline Phosphatase: 79 U/L (ref 38–126)
Anion gap: 12 (ref 5–15)
BUN: 32 mg/dL — ABNORMAL HIGH (ref 6–20)
CHLORIDE: 104 mmol/L (ref 101–111)
CO2: 20 mmol/L — AB (ref 22–32)
CREATININE: 2.07 mg/dL — AB (ref 0.44–1.00)
Calcium: 8.3 mg/dL — ABNORMAL LOW (ref 8.9–10.3)
GFR calc Af Amer: 24 mL/min — ABNORMAL LOW (ref >60)
GFR calc non Af Amer: 21 mL/min — ABNORMAL LOW (ref >60)
Glucose, Bld: 92 mg/dL (ref 65–99)
Potassium: 3.5 mmol/L (ref 3.5–5.1)
SODIUM: 136 mmol/L (ref 135–145)
Total Bilirubin: 1.3 mg/dL — ABNORMAL HIGH (ref 0.3–1.2)
Total Protein: 5.7 g/dL — ABNORMAL LOW (ref 6.5–8.1)

## 2016-12-22 LAB — VITAMIN B12: Vitamin B-12: 1147 pg/mL — ABNORMAL HIGH (ref 180–914)

## 2016-12-22 LAB — FERRITIN: FERRITIN: 433 ng/mL — AB (ref 11–307)

## 2016-12-22 LAB — CBC
HCT: 25 % — ABNORMAL LOW (ref 36.0–46.0)
Hemoglobin: 8.3 g/dL — ABNORMAL LOW (ref 12.0–15.0)
MCH: 27.2 pg (ref 26.0–34.0)
MCHC: 33.2 g/dL (ref 30.0–36.0)
MCV: 82 fL (ref 78.0–100.0)
Platelets: 296 K/uL (ref 150–400)
RBC: 3.05 MIL/uL — ABNORMAL LOW (ref 3.87–5.11)
RDW: 17.4 % — ABNORMAL HIGH (ref 11.5–15.5)
WBC: 9.3 K/uL (ref 4.0–10.5)

## 2016-12-22 LAB — FOLATE: Folate: 4.6 ng/mL — ABNORMAL LOW (ref >5.9)

## 2016-12-22 MED ORDER — ADULT MULTIVITAMIN W/MINERALS CH
1.0000 | ORAL_TABLET | Freq: Every day | ORAL | Status: DC
  Administered 2016-12-23 – 2016-12-25 (×3): 1 via ORAL
  Filled 2016-12-22 (×4): qty 1

## 2016-12-22 NOTE — Progress Notes (Signed)
Initial Nutrition Assessment  DOCUMENTATION CODES:   Non-severe (moderate) malnutrition in context of acute illness/injury, Underweight Malnutrition (non-severe) related to acute right pubic and sacral fx as evidenced by energy intake <75% for >7 days and mild-moderate depletions muscle (temproal, scapula and anterior thigh).  INTERVENTION:   Downgrade diet to pureed due to chewing difficulty and poor meal intake   Ensure Enlive po BID, each supplement provides 350 kcal and 20 grams of protein   Add Multivitamin   NUTRITION DIAGNOSIS:   Increased nutrient needs related to  (fall resulting in multiple fx (sacral and pubic)) as evidenced by estimated needs.   GOAL:   Patient will meet greater than or equal to 90% of their needs   MONITOR:   Po intake meals and supplements, labs and discharge planning     REASON FOR ASSESSMENT: Malnutrition Screen/Low [place]    ASSESSMENT:   Kimberly Vincent is an 83 yo fm with hx of dementia, CKD-3, Lung cancer and failure to thrive. She presents with multiple fx following a fall. 09/2015 CT findings follow up lung screen- NO recurrent or metastatic disease. 12/22/16  CT right hip findings -multiple fx right sacral and pubic bones  Kimberly Vincent lives alone but family helps her with providing foods. She eats regular diet and likes pintos, chicken and potatoes. Her dentition is poor (missing several teeth). We talked about helping increase her meal intake by changing the texture of her foods to decrease the work of chewing.   Weight hx reviewed. She is not able to tell me usual body wt today but says she's always been a small person. Her niece is here but is unaware of more exact wt hx.  Nutrition exam: she has mild to moderate muscle depletions and mild edema bilateral lower extremities.   Recent Labs Lab 12/21/16 1026 12/22/16 0539  NA 134* 136  K 4.1 3.5  CL 97* 104  CO2 16* 20*  BUN 42* 32*  CREATININE 3.12* 2.07*  CALCIUM 9.5 8.3*   GLUCOSE 147* 92   Labs: BUN-32, Cr 2.07  Meds: Ensure Enlive  Diet Order:  DIET SOFT Room service appropriate? Yes; Fluid consistency: Thin  Skin:  Reviewed, no issues  Last BM:  6/5   Height:   Ht Readings from Last 1 Encounters:  12/21/16 5\' 3"  (1.6 m)    Weight:   Wt Readings from Last 1 Encounters:  12/22/16 91 lb 0.8 oz (41.3 kg)    Ideal Body Weight:     BMI:  Body mass index is 16.13 kg/m.  Estimated Nutritional Needs:   Kcal:  1230-1435 (30-35 kcal/kg)  Protein:  32-35 gr (0.7-0.8 gr/kg)  Fluid:  1.1-1.2 liters daily  EDUCATION NEEDS:   Education needs addressed (RD-encouraged overall healthy food choices )  Colman Cater Kimberly,RD,CSG,LDN Office: [phone] Pager: [phone]

## 2016-12-22 NOTE — Progress Notes (Addendum)
PROGRESS NOTE    Kimberly Vincent  GGE:[id number] DOB: [birth year] DOA: 12/21/2016 PCP: [facility]    Brief Narrative: 83 yo female from home admitted for AKI on CKD, baseline unclear, dehydration, FTT, and fell.  She is doing better since admission with IVF and adjusting her meds.  Her edema improved, but she now complained of right hip pain. She also has a murmur and is getting an ECHO. She has dementia, but it is mild.    Assessment & Plan:   Active Problems:   Failure to thrive in adult   AKI (acute kidney injury) ([place])   Pedal edema   HTN (hypertension)   Dementia   CKD (chronic kidney disease), stage III   Tachycardia   Fall at home, initial encounter  AKI on CKD:  She is clinically volume depleted.  Will continue with IVF.  Cr is improving.  If better tomorrow, will d/c her home.   Will obtain renal US.  Follow Cr carefully. Baseline unclear.   Pedal edema:  I don't think she has volume overload.  No DVT.  Suspect dependent edema, aggravated with Norvasc.  Interestingly, her edema has resolved with IVF.  This is confirming the side effect of Norvasc.   Dementia:  Very mild.  She can still make informed decision in my opinion.  Hip pain after fall:  Will obtain right hip CT to r/out Fx.   Anemia:  No sign of bleeding.  Could be chronic, down with rehydration.  Check anemia panel.   HTN:  Stable.  WIll follow.  Change norvasc to Lopressor to avoid edema.  Tachycardia:  resolved after one period of PSVT with IVF.    Heart murmur:  Obtain ECHO.   DVT prophylaxis:  subQ heparin.  Code Status: FULL CODE.  Family Communication: daughter and friend at bedside.  Disposition Plan: likely to home.  Consults called: None.  Admission status: inpatient.    Antimicrobials: Anti-infectives    None       Subjective:  Doing well.   Objective: Vitals:   12/21/16 1723 12/21/16 1900 12/21/16 2227 12/22/16 0535  BP: (!) 147/61  (!) 145/56 (!)  145/57  Pulse: (!) 112  (!) 110 (!) 106  Resp: 20  20 20   Temp: 97.9 F (36.6 C)  98 F (36.7 C) 97.6 F (36.4 C)  TempSrc: Oral  Oral Oral  SpO2: 100% 99% 99% 99%  Weight: 39.5 kg (87 lb 1.3 oz)   41.3 kg (91 lb 0.8 oz)  Height: 5\' 3"  (1.6 m)       Intake/Output Summary (Last 24 hours) at 12/22/16 1016 Last data filed at 12/22/16 0900  Gross per 24 hour  Intake          1313.33 ml  Output              250 ml  Net          1063.33 ml   Filed Weights   12/21/16 0927 12/21/16 1723 12/22/16 0535  Weight: 37.2 kg (82 lb) 39.5 kg (87 lb 1.3 oz) 41.3 kg (91 lb 0.8 oz)    Examination:  General exam: Appears calm and comfortable  Respiratory system: Clear to auscultation. Respiratory effort normal. Cardiovascular system: S1 & S2 heard, RRR. No JVD, murmurs, rubs, gallops or clicks. No pedal edema. Gastrointestinal system: Abdomen is nondistended, soft and nontender. No organomegaly or masses felt. Normal bowel sounds heard. Central nervous system: Alert and oriented. No focal  neurological deficits. Extremities: Symmetric 5 x 5 power. Pain on the right hip.  Skin: No rashes, lesions or ulcers Psychiatry: Judgement and insight appear normal. Mood & affect appropriate.   Data Reviewed: I have personally reviewed following labs and imaging studies  CBC:  Recent Labs Lab 12/21/16 1026 12/22/16 0539  WBC 13.3* 9.3  NEUTROABS 12.0*  --   HGB 10.0* 8.3*  HCT 29.5* 25.0*  MCV 82.2 82.0  PLT 314 195   Basic Metabolic Panel:  Recent Labs Lab 12/21/16 1026 12/22/16 0539  NA 134* 136  K 4.1 3.5  CL 97* 104  CO2 16* 20*  GLUCOSE 147* 92  BUN 42* 32*  CREATININE 3.12* 2.07*  CALCIUM 9.5 8.3*   GFR: Estimated Creatinine Clearance: 13.2 mL/min (A) (by C-G formula based on SCr of 2.07 mg/dL (H)). Liver Function Tests:  Recent Labs Lab 12/22/16 0539  AST 27  ALT 23  ALKPHOS 79  BILITOT 1.3*  PROT 5.7*  ALBUMIN 2.9*   Thyroid Function Tests:  Recent Labs   12/21/16 1806  TSH 0.912   Radiology Studies: Dg Chest 1 View  Result Date: 12/21/2016 CLINICAL DATA:  Shortness of breath. EXAM: CHEST 1 VIEW COMPARISON:  06/25/2014 FINDINGS: Small pleural effusions. Chronic scar-like opacity over the right base. Status post right upper lobectomy. No air bronchogram or interstitial coarsening. Normal heart size. Stable mediastinal contours with postoperative distortion on the right. Prominent atherosclerotic calcification. IMPRESSION: 1. Small bilateral pleural effusion.  No visible edema or pneumonia. 2. COPD and right upper lobectomy. Electronically Signed   By: Monte Fantasia M.D.   On: 12/21/2016 10:01   US Venous Img Lower Bilateral  Result Date: 12/21/2016 CLINICAL DATA:  Bilateral lower extremity edema EXAM: BILATERAL LOWER EXTREMITY VENOUS DUPLEX ULTRASOUND TECHNIQUE: Gray-scale sonography with graded compression, as well as color Doppler and duplex ultrasound were performed to evaluate the lower extremity deep venous systems from the level of the common femoral vein and including the common femoral, femoral, profunda femoral, popliteal and calf veins including the posterior tibial, peroneal and gastrocnemius veins when visible. The superficial great saphenous vein was also interrogated. Spectral Doppler was utilized to evaluate flow at rest and with distal augmentation maneuvers in the common femoral, femoral and popliteal veins. COMPARISON:  None. FINDINGS: RIGHT LOWER EXTREMITY Common Femoral Vein: No evidence of thrombus. Normal compressibility, respiratory phasicity and response to augmentation. Saphenofemoral Junction: No evidence of thrombus. Normal compressibility and flow on color Doppler imaging. Profunda Femoral Vein: No evidence of thrombus. Normal compressibility and flow on color Doppler imaging. Femoral Vein: No evidence of thrombus. Normal compressibility, respiratory phasicity and response to augmentation. Popliteal Vein: No evidence of thrombus.  Normal compressibility, respiratory phasicity and response to augmentation. Calf Veins: No evidence of thrombus. Normal compressibility and flow on color Doppler imaging. Superficial Great Saphenous Vein: No evidence of thrombus. Normal compressibility and flow on color Doppler imaging. Venous Reflux:  None. Other Findings:  None. LEFT LOWER EXTREMITY Common Femoral Vein: No evidence of thrombus. Normal compressibility, respiratory phasicity and response to augmentation. Saphenofemoral Junction: No evidence of thrombus. Normal compressibility and flow on color Doppler imaging. Profunda Femoral Vein: No evidence of thrombus. Normal compressibility and flow on color Doppler imaging. Femoral Vein: No evidence of thrombus. Normal compressibility, respiratory phasicity and response to augmentation. Popliteal Vein: No evidence of thrombus. Normal compressibility, respiratory phasicity and response to augmentation. Calf Veins: No evidence of thrombus. Normal compressibility and flow on color Doppler imaging. Superficial Great Saphenous Vein: No  evidence of thrombus. Normal compressibility and flow on color Doppler imaging. Venous Reflux:  None. Other Findings:  There is soft tissue edema bilaterally. IMPRESSION: No evidence of deep venous thrombosis in either lower extremity. There is soft tissue edema in both lower extremities. Electronically Signed   By: Lowella Grip III M.D.   On: 12/21/2016 13:27    Scheduled Meds: . aspirin EC  81 mg Oral Daily  . feeding supplement (ENSURE ENLIVE)  237 mL Oral BID BM  . heparin  5,000 Units Subcutaneous Q8H   Continuous Infusions: . sodium chloride 100 mL/hr at 12/21/16 1828     LOS: 1 day   Nicoya Friel, MD FACP Hospitalist.   If 7PM-7AM, please contact night-coverage [email or web address] Password [email or web address] 12/22/2016, 10:16 AM

## 2016-12-22 NOTE — Progress Notes (Signed)
*  PRELIMINARY RESULTS* Echocardiogram 2D Echocardiogram has been performed.  Samuel Germany 12/22/2016, 2:23 PM

## 2016-12-22 NOTE — Evaluation (Signed)
Physical Therapy Evaluation Patient Details Name: Kimberly Vincent MRN: [id number] DOB: 10-09-1929 Today's Date: 12/22/2016   History of Present Illness  Kimberly Vincent is an 83yo black female who comes to [facility] after sustaining a fall at home. She has had decreased PO intake of food/drink over the past few weeks. While admitted the pt has been tachycardic, with CTM calling once regarding a run of SVT with HR: 150s. PMH: dementia (mild), CKD3, HTN, LungCA s/p resection, and pedal edema. Pt lives alone in a 2-story apartment, bed room on 2nd floor, and has recently started receiving i home aide services 4d/w. She is typically indep in AMB and ADL, but requires some assistance with IADL, housework, transportation, and meals.   Clinical Impression  Pt admitted with above diagnosis. Pt currently with functional limitations due to the deficits listed below (see "PT Problem List"). Upon entry, the patient is received semirecumbent in bed, niece '[name]' is present. The pt is awake and agreeable to participate. No acute distress noted at this time, pt only reporting some limiting pain, but only during attempted mobility. Pt remain tachycardic throughout visit 108-115bpm. The pt is alert and oriented x3, pleasant, conversational, and following simple and multi-step commands consistently. MMT screening reveals 3-/5 of the RLE associated with pain in Rt groin at end range. Hip CT report available after this visit revealing multiple Rt pubic/sacral fractures, this information unavailable at time of evaluation. Functional mobility assessment demonstrates severe weakess and pain inhibition from the Rt groin area, the patient now requiring maxA+1 to perform supine to sitting EOB. No transfers/gait attempted as d/t rt hip pain and CT imaging pending. Pt will benefit from skilled PT intervention to increase independence and safety with basic mobility in preparation for discharge to the venue listed below.       Follow Up  Recommendations SNF    Equipment Recommendations   (TBD by receiving facitlity)    Recommendations for Other Services       Precautions / Restrictions Precautions Precautions: Fall Restrictions Weight Bearing Restrictions: No Other Position/Activity Restrictions: My be updated after hip CT       Mobility  Bed Mobility               General bed mobility comments: attempted, deferred due to pain as pt is now scheduled for hip CT   Transfers                    Ambulation/Gait                Stairs            Wheelchair Mobility    Modified Rankin (Stroke Patients Only)       Balance                                             Pertinent Vitals/Pain Pain Assessment: Faces Faces Pain Scale: Hurts even more Pain Location: Right groin; pain with AAROM of RLE only; no pain at rest  Pain Intervention(s): Limited activity within patient's tolerance;Repositioned;Monitored during session (pt awaiting CT of hip )    Home Living Family/patient expects to be discharged to:: Private residence Living Arrangements: Alone Available Help at Discharge: Personal care attendant;Available PRN/intermittently;Family Type of Home: Apartment Home Access: Level entry     Home Layout: Two level Home Equipment: [place] - 2 wheels;Walker -  4 wheels      Prior Function Level of Independence: Needs assistance   Gait / Transfers Assistance Needed: limited community distance with AD; household s AD   ADL's / Homemaking Assistance Needed: Indep in ADL; needs help with housework, meals, and tranportation         Hand Dominance   Dominant Hand: Right    Extremity/Trunk Assessment   Upper Extremity Assessment Upper Extremity Assessment: Generalized weakness;Overall WFL for tasks assessed (grips mildly weak, elbows grossly 4+/5)    Lower Extremity Assessment Lower Extremity Assessment: RLE deficits/detail RLE Deficits / Details:  requires assistance for AROM, due to pain; range also limited, pain worse with endrange       Communication   Communication: No difficulties  Cognition Arousal/Alertness: Awake/alert Behavior During Therapy: WFL for tasks assessed/performed Overall Cognitive Status: Within Functional Limits for tasks assessed                                        General Comments      Exercises General Exercises - Lower Extremity Ankle Circles/Pumps: AROM;Both;20 reps;Supine Heel Slides: AAROM;Both;Supine;10 reps (needs Mod assistance  on Rt with range limited due to pain ) Hip ABduction/ADduction: AAROM;Both;10 reps;Supine (needs Mod assistance  on Rt with range limited due to pain ) Straight Leg Raises: AAROM;Both (attemped; AROM on Left, AAROM on rt with severe pain limitations. )   Assessment/Plan    PT Assessment Patient needs continued PT services  PT Problem List Decreased strength;Decreased range of motion;Decreased activity tolerance;Decreased mobility;Cardiopulmonary status limiting activity;Pain       PT Treatment Interventions Functional mobility training;Therapeutic activities;Therapeutic exercise    PT Goals (Current goals can be found in the Care Plan section)  Acute Rehab PT Goals Patient Stated Goal: regain independent mobility PT Goal Formulation: With patient Time For Goal Achievement: 01/05/17 Potential to Achieve Goals: Good    Frequency 7X/week   Barriers to discharge Inaccessible home environment;Decreased caregiver support      Co-evaluation               AM-PAC PT "6 Clicks" Daily Activity  Outcome Measure Difficulty turning over in bed (including adjusting bedclothes, sheets and blankets)?: Total Difficulty moving from lying on back to sitting on the side of the bed? : Total Difficulty sitting down on and standing up from a chair with arms (e.g., wheelchair, bedside commode, etc,.)?: Total Help needed moving to and from a bed to chair  (including a wheelchair)?: Total Help needed walking in hospital room?: Total Help needed climbing 3-5 steps with a railing? : Total 6 Click Score: 6    End of Session   Activity Tolerance: Patient tolerated treatment well;Patient limited by pain Patient left: in bed;with call bell/phone within reach;with family/visitor present Nurse Communication:  (catheter placement needed) PT Visit Diagnosis: Other abnormalities of gait and mobility (R26.89);History of falling (Z91.81);Pain Pain - Right/Left: Right Pain - part of body: Hip    Time: 7371-0626 PT Time Calculation (min) (ACUTE ONLY): 29 min   Charges:     PT Treatments $Therapeutic Activity: 8-22 mins   PT G Codes:        1:10 PM, 01/08/17 Etta Grandchild, PT, DPT Physical Therapist - [place] [phone] (Office)  Buccola,Allan C 01-08-2017, 1:04 PM

## 2016-12-22 NOTE — Care Management Note (Addendum)
Case Management Note  Patient Details  Name: AZRIELLA MATTIA MRN: [id number] Date of Birth: [birth year]  Subjective/Objective:  Chart reviewed. Patient from home, ind PTA. Adm with fall/AKI/pedal edema. Unable to participate with PT this morning. CT of hip pending.              Action/Plan: CM following for needs.   ADDENDUM: Patient recommended for SNF. CT revealed fractures. CSW consult placed.  Expected Discharge Date:    unk              Expected Discharge Plan:     In-House Referral:     Discharge planning Services  CM Consult  Post Acute Care Choice:    Choice offered to:     DME Arranged:    DME Agency:     HH Arranged:    HH Agency:     Status of Service:  In process, will continue to follow  If discussed at Long Length of Stay Meetings, dates discussed:    Additional Comments:  Eliaz Fout, Chauncey Reading, RN 12/22/2016, 11:04 AM

## 2016-12-23 DIAGNOSIS — E440 Moderate protein-calorie malnutrition: Secondary | ICD-10-CM | POA: Insufficient documentation

## 2016-12-23 LAB — BASIC METABOLIC PANEL
Anion gap: 8 (ref 5–15)
BUN: 25 mg/dL — AB (ref 6–20)
CALCIUM: 8.1 mg/dL — AB (ref 8.9–10.3)
CO2: 23 mmol/L (ref 22–32)
Chloride: 106 mmol/L (ref 101–111)
Creatinine, Ser: 1.53 mg/dL — ABNORMAL HIGH (ref 0.44–1.00)
GFR calc non Af Amer: 30 mL/min — ABNORMAL LOW (ref >60)
GFR, EST AFRICAN AMERICAN: 35 mL/min — AB (ref >60)
Glucose, Bld: 88 mg/dL (ref 65–99)
Potassium: 3.4 mmol/L — ABNORMAL LOW (ref 3.5–5.1)
SODIUM: 137 mmol/L (ref 135–145)

## 2016-12-23 LAB — CBC
HCT: 25.7 % — ABNORMAL LOW (ref 36.0–46.0)
Hemoglobin: 8.7 g/dL — ABNORMAL LOW (ref 12.0–15.0)
MCH: 27.9 pg (ref 26.0–34.0)
MCHC: 33.9 g/dL (ref 30.0–36.0)
MCV: 82.4 fL (ref 78.0–100.0)
Platelets: 286 10*3/uL (ref 150–400)
RBC: 3.12 MIL/uL — ABNORMAL LOW (ref 3.87–5.11)
RDW: 17.6 % — AB (ref 11.5–15.5)
WBC: 8 10*3/uL (ref 4.0–10.5)

## 2016-12-23 MED ORDER — LISINOPRIL 5 MG PO TABS
2.5000 mg | ORAL_TABLET | Freq: Every day | ORAL | Status: DC
  Administered 2016-12-23 – 2016-12-25 (×3): 2.5 mg via ORAL
  Filled 2016-12-23 (×3): qty 1

## 2016-12-23 NOTE — Progress Notes (Signed)
Physical Therapy Treatment Patient Details Name: Kimberly Vincent MRN: [id number] DOB: [birth year] Today's Date: 12/23/2016    History of Present Illness Kimberly Vincent is an 83yo black female who comes to [facility] after sustaining a fall at home. She has had decreased PO intake of food/drink over the past few weeks. While admitted the pt has been tachycardic, with CTM calling once regarding a run of SVT with HR: 150s. PMH: dementia (mild), CKD3, HTN, LungCA s/p resection, and pedal edema. Pt lives alone in a 2-story apartment, bed room on 2nd floor, and has recently started receiving i home aide services 4d/w. She is typically indep in AMB and ADL, but requires some assistance with IADL, housework, transportation, and meals.     PT Comments    Pt received lying in bed with her nieces at bedside. Pt was agreeable to PT treatment this date. PT educated pt and her nieces that nursing had notified PT that pt was WBAT prior to entering the room and all verbalized understanding. Pt max A for supine <> sit and for transfers with RW. She was able to perform 2 sit <> stands with the RW this date but demo'd increased RLE extension throughout transfer. Once standing, pt able to perform 3 reps of lateral weight shifting onto the RLE with good tolerance. Pt left lying in bed with family at bedside and nursing made aware of pt's mobility status. Continue to recommend SNF upon d/c.     Follow Up Recommendations  SNF     Equipment Recommendations   (TBD by receiving facitlity)    Recommendations for Other Services       Precautions / Restrictions Precautions Precautions: Fall Restrictions Weight Bearing Restrictions: No Other Position/Activity Restrictions: WBAT per RN    Mobility  Bed Mobility Overal bed mobility: Needs Assistance Bed Mobility: Supine to Sit;Sit to Supine     Supine to sit: Max assist;Mod assist Sit to supine: Max assist   General bed mobility comments: mod to max A for supine > sit and  max A for sit > supine due to pain and weakness  Transfers Overall transfer level: Needs assistance Equipment used: Rolling walker (2 wheeled) Transfers: Sit to/from Stand Sit to Stand: Max assist         General transfer comment: sit <> stand from EOB x 2 reps with RW; pt maintained RLE extended and did not put full weight through it during the transfer  Ambulation/Gait             General Gait Details: not attempted this date due to increased pain with standing   Stairs            Wheelchair Mobility    Modified Rankin (Stroke Patients Only)       Balance Overall balance assessment: Needs assistance Sitting-balance support: No upper extremity supported Sitting balance-Leahy Scale: Good Sitting balance - Comments: initial increased posterior trunk lean following supine > sit which required assistance to maintain balance, but once pt shifted her weight forward and got to EOB, her sitting balance was WNL Postural control: Posterior lean Standing balance support: Bilateral upper extremity supported Standing balance-Leahy Scale: Fair Standing balance comment: once standing, she did not demo any LOBs or unsteadiness with the RW and was able to weight shift back and forth a few times                            Cognition Arousal/Alertness: Awake/alert Behavior  During Therapy: WFL for tasks assessed/performed Overall Cognitive Status: Within Functional Limits for tasks assessed                                        Exercises Other Exercises Other Exercises: standing lateral weight shifts x 3 reps with RW; pt able to WB through RLE with good tolerance once in full upright standing position    General Comments        Pertinent Vitals/Pain Pain Assessment: Faces Faces Pain Scale: Hurts even more Pain Location: R groin during movement. Pt reported 0/10 pain at beginning and EOS Pain Descriptors / Indicators: Aching Pain  Intervention(s): Limited activity within patient's tolerance;Monitored during session;Repositioned    Home Living                      Prior Function            PT Goals (current goals can now be found in the care plan section) Acute Rehab PT Goals Patient Stated Goal: regain independent mobility PT Goal Formulation: With patient Time For Goal Achievement: 01/05/17 Potential to Achieve Goals: Good    Frequency    7X/week      PT Plan      Co-evaluation              AM-PAC PT "6 Clicks" Daily Activity  Outcome Measure  Difficulty turning over in bed (including adjusting bedclothes, sheets and blankets)?: A Lot Difficulty moving from lying on back to sitting on the side of the bed? : Total Difficulty sitting down on and standing up from a chair with arms (e.g., wheelchair, bedside commode, etc,.)?: Total Help needed moving to and from a bed to chair (including a wheelchair)?: Total Help needed walking in hospital room?: Total Help needed climbing 3-5 steps with a railing? : Total 6 Click Score: 7    End of Session Equipment Utilized During Treatment: Gait belt;Other (comment) (RW) Activity Tolerance: Patient tolerated treatment well;Patient limited by pain;Patient limited by fatigue Patient left: in bed;with call bell/phone within reach;with family/visitor present;with bed alarm set Nurse Communication: Mobility status;Weight bearing status (catheter placement needed) PT Visit Diagnosis: Other abnormalities of gait and mobility (R26.89);History of falling (Z91.81);Pain Pain - Right/Left: Right Pain - part of body: Hip     Time: 1422-1446 PT Time Calculation (min) (ACUTE ONLY): 24 min  Charges:  $Therapeutic Activity: 23-37 mins                    G Codes:  Functional Assessment Tool Used: AM-PAC 6 Clicks Basic Mobility Functional Limitation: Mobility: Walking and moving around Mobility: Walking and Moving Around Current Status (B8466): At least 60  percent but less than 80 percent impaired, limited or restricted Mobility: Walking and Moving Around Goal Status [phone]): At least 40 percent but less than 60 percent impaired, limited or restricted     Geraldine Solar PT, DPT

## 2016-12-23 NOTE — Progress Notes (Signed)
PROGRESS NOTE    Kimberly Vincent  QMV:[id number] DOB: 12/03/1929 DOA: 12/21/2016 PCP: [facility]    Brief Narrative: 83 yo female from home admitted for AKI on CKD, baseline unclear, dehydration, FTT, and fell.  She is doing better since admission with IVF and adjusting her meds.  Her edema improved, but she now complained of right hip pain. She also has a murmur and is getting an ECHO. She has dementia, but it is mild.  She was given IVF, and her Cr improved.  She had pain in her hip, and CT showed several pelvis fractures.    Assessment & Plan:   Active Problems:   Failure to thrive in adult   AKI (acute kidney injury) ([place])   Pedal edema   HTN (hypertension)   Dementia   CKD (chronic kidney disease), stage III   Tachycardia   Fall at home, initial encounter   Malnutrition of moderate degree  AKI on CKD: She is clinically volume depleted. Will continue with IVF.  Cr is improving. It is down to 1.5 now. US showed medical kidney, no hydronephrosis.  Pedal edema: I don't think she has volume overload. No DVT. Suspect dependent edema, aggravated with Norvasc. Interestingly, her edema has resolved with IVF.  This is confirming the side effect of Norvasc.   Dementia: Very mild. She can still make informed decision in my opinion.  Hip pain after fall:  several pelvis Fx.  Suspect non surgical, but will consult orthopedics to confirm.  She will need STR and is agreeable to go.  Family updated.   Anemia:  No sign of bleeding.  Could be chronic, down with rehydration.  Check anemia panel.   HTN: Stable. WIll follow. Change norvasc to Lopressor to avoid edema.  Tachycardia: resolved after one period of PSVT with IVF.    Heart murmur: ECHO showed aortic slerosis, mild MR and small effusion.  Its was hyperdynamics and evidence of severe pulmonary HTN with moderate TR.    DVT prophylaxis:subQ heparin.  Code Status:FULL CODE.  Family  Communication:daughter and friend at bedside.  Disposition Plan:likely to home.  Consults called:None.  Admission status:inpatient.    Consultants:   Ortho.  Procedures:   None.   Antimicrobials: Anti-infectives    None       Subjective:  Doing OK.   Objective: Vitals:   12/22/16 1428 12/22/16 2003 12/22/16 2224 12/23/16 0449  BP: (!) 131/49  134/61 (!) 128/58  Pulse: (!) 110  (!) 106 (!) 106  Resp: 18  20 20   Temp: 98.6 F (37 C)  98.6 F (37 C) 98 F (36.7 C)  TempSrc: Oral  Oral Oral  SpO2: 97% 95% 95% 95%  Weight:    41.3 kg (90 lb 15.7 oz)  Height:        Intake/Output Summary (Last 24 hours) at 12/23/16 1330 Last data filed at 12/23/16 0300  Gross per 24 hour  Intake             1940 ml  Output              600 ml  Net             1340 ml   Filed Weights   12/21/16 1723 12/22/16 0535 12/23/16 0449  Weight: 39.5 kg (87 lb 1.3 oz) 41.3 kg (91 lb 0.8 oz) 41.3 kg (90 lb 15.7 oz)    Examination:  General exam: Appears calm and comfortable  Respiratory system:  Clear to auscultation. Respiratory effort normal. Cardiovascular system: S1 & S2 heard, RRR. No JVD, murmurs, rubs, gallops or clicks. No pedal edema. Gastrointestinal system: Abdomen is nondistended, soft and nontender. No organomegaly or masses felt. Normal bowel sounds heard. Central nervous system: Alert and oriented. No focal neurological deficits. Extremities: Symmetric 5 x 5 power. Skin: No rashes, lesions or ulcers Psychiatry: Judgement and insight appear normal. Mood & affect appropriate.   Data Reviewed: I have personally reviewed following labs and imaging studies  CBC:  Recent Labs Lab 12/21/16 1026 12/22/16 0539 12/23/16 0553  WBC 13.3* 9.3 8.0  NEUTROABS 12.0*  --   --   HGB 10.0* 8.3* 8.7*  HCT 29.5* 25.0* 25.7*  MCV 82.2 82.0 82.4  PLT 314 296 160   Basic Metabolic Panel:  Recent Labs Lab 12/21/16 1026 12/22/16 0539 12/23/16 0553  NA 134* 136 137  K 4.1  3.5 3.4*  CL 97* 104 106  CO2 16* 20* 23  GLUCOSE 147* 92 88  BUN 42* 32* 25*  CREATININE 3.12* 2.07* 1.53*  CALCIUM 9.5 8.3* 8.1*   GFR: Estimated Creatinine Clearance: 17.8 mL/min (A) (by C-G formula based on SCr of 1.53 mg/dL (H)). Liver Function Tests:  Recent Labs Lab 12/22/16 0539  AST 27  ALT 23  ALKPHOS 79  BILITOT 1.3*  PROT 5.7*  ALBUMIN 2.9*   Thyroid Function Tests:  Recent Labs  12/21/16 1806  TSH 0.912   Anemia Panel:  Recent Labs  12/22/16 1057  VITAMINB12 1,147*  FOLATE 4.6*  FERRITIN 433*  TIBC 203*  IRON 24*  RETICCTPCT 1.9   Radiology Studies: US Renal  Result Date: 12/22/2016 CLINICAL DATA:  Acute renal injury EXAM: RENAL / URINARY TRACT ULTRASOUND COMPLETE COMPARISON:  None. FINDINGS: Right Kidney: Length: 8.9 cm. Diffuse increased echogenicity is noted. Simple cysts are noted. The largest of these measures 1.2 cm. No hydronephrosis is noted. Left Kidney: Length: 8.5 cm.  Mild increased echogenicity is noted. Bladder: Appears normal for degree of bladder distention. Note is made of bilateral pleural effusions. IMPRESSION: Right renal cysts. Increased echogenicity of the kidneys consistent with medical renal disease. Bilateral effusions similar to that seen on recent chest x-ray. Electronically Signed   By: Inez Catalina M.D.   On: 12/22/2016 10:34   Ct Hip Right Wo Contrast  Result Date: 12/22/2016 CLINICAL DATA:  Right hip pain. The patient may have fallen yesterday. EXAM: CT OF THE RIGHT HIP WITHOUT CONTRAST TECHNIQUE: Multidetector CT imaging of the right hip was performed according to the standard protocol. Multiplanar CT image reconstructions were also generated. COMPARISON:  None. FINDINGS: Bones/Joint/Cartilage There is a minimally displaced fracture of the right inferior pubic ramus along with fracture of the right superior pubic ramus at the junction with the acetabulum. There is also a vertical fracture through the right sacral ala. There is  also fractures through the right pubic body. The proximal right femur is intact. There is edema in the soft tissues lateral to the right hip which may represent a soft tissue addendum a contusion. IMPRESSION: 1. Fracture of the right sacral ala. 2. Fracture of the right pubic body. 3. Fractures of the right inferior and superior pubic rami. Electronically Signed   By: Lorriane Shire M.D.   On: 12/22/2016 12:26    Scheduled Meds: . aspirin EC  81 mg Oral Daily  . feeding supplement (ENSURE ENLIVE)  237 mL Oral BID BM  . heparin  5,000 Units Subcutaneous Q8H  . multivitamin with  minerals  1 tablet Oral Daily   Continuous Infusions: . sodium chloride 100 mL/hr at 12/23/16 0158     LOS: 2 days   Kelvyn Schunk, MD [facility]   If 7PM-7AM, please contact night-coverage [email or web address] Password [email or web address] 12/23/2016, 1:30 PM

## 2016-12-24 LAB — BASIC METABOLIC PANEL
Anion gap: 10 (ref 5–15)
BUN: 24 mg/dL — AB (ref 6–20)
CHLORIDE: 106 mmol/L (ref 101–111)
CO2: 21 mmol/L — AB (ref 22–32)
Calcium: 7.9 mg/dL — ABNORMAL LOW (ref 8.9–10.3)
Creatinine, Ser: 1.47 mg/dL — ABNORMAL HIGH (ref 0.44–1.00)
GFR calc Af Amer: 37 mL/min — ABNORMAL LOW (ref >60)
GFR calc non Af Amer: 32 mL/min — ABNORMAL LOW (ref >60)
Glucose, Bld: 96 mg/dL (ref 65–99)
POTASSIUM: 3.3 mmol/L — AB (ref 3.5–5.1)
SODIUM: 137 mmol/L (ref 135–145)

## 2016-12-24 LAB — CBC
HEMATOCRIT: 27.4 % — AB (ref 36.0–46.0)
Hemoglobin: 9.2 g/dL — ABNORMAL LOW (ref 12.0–15.0)
MCH: 27.7 pg (ref 26.0–34.0)
MCHC: 33.6 g/dL (ref 30.0–36.0)
MCV: 82.5 fL (ref 78.0–100.0)
PLATELETS: 305 10*3/uL (ref 150–400)
RBC: 3.32 MIL/uL — AB (ref 3.87–5.11)
RDW: 17.9 % — AB (ref 11.5–15.5)
WBC: 8.5 10*3/uL (ref 4.0–10.5)

## 2016-12-24 MED ORDER — FOLIC ACID 1 MG PO TABS
1.0000 mg | ORAL_TABLET | Freq: Every day | ORAL | Status: DC
  Administered 2016-12-24 – 2016-12-25 (×2): 1 mg via ORAL
  Filled 2016-12-24 (×2): qty 1

## 2016-12-24 NOTE — Progress Notes (Signed)
PROGRESS NOTE    Kimberly Vincent  VQX:[id number] DOB: 10-Apr-1930 DOA: 12/21/2016 PCP: [facility]    Brief Narrative: 83 yo female from home admitted for AKI on CKD, baseline unclear, dehydration, FTT, and fell. She is doing better since admission with IVF and adjusting her meds. Her edema improved, but she now complained of right hip pain. She also has a murmur and is getting an ECHO. She has dementia, but it is mild. She was given IVF, and her Cr improved.  She had pain in her hip, and CT showed several pelvis fractures. Anemia panel showed ACD, and her folate level was low.    Assessment & Plan:   Active Problems:   Failure to thrive in adult   AKI (acute kidney injury) ([place])   Pedal edema   HTN (hypertension)   Dementia   CKD (chronic kidney disease), stage III   Tachycardia   Fall at home, initial encounter   Malnutrition of moderate degree  AKI on CKD: She is clinically volume depleted. Will continue with IVF. Cr is improving. It is down to 1.5 now. US showed medical kidney, no hydronephrosis.  Pedal edema: I don't think she has volume overload. No DVT. Suspect dependent edema, aggravated with Norvasc. Interestingly, her edema has resolved with IVF. This is confirming the side effect of Norvasc.   Dementia: Very mild. She can still make informed decision in my opinion.  Hip pain after fall: several pelvis Fx.  Suspect non surgical, but will consult orthopedics to confirm.  She will need STR and is agreeable to go.  Family updated.  No change in status, and pain appeared to be controlled.   Anemia: No sign of bleeding. Could be chronic, down with rehydration. Likely ACD.  Will give folate as level was low.   HTN: Stable. WIll follow. Change norvasc to Lopressor to avoid edema.  Tachycardia: resolved after one period of PSVT with IVF.   Heart murmur: ECHO showed aortic slerosis, mild MR and small effusion.  Its was  hyperdynamics and evidence of severe pulmonary HTN with moderate TR.    DVT prophylaxis:subQ heparin.  Code Status:FULL CODE.  Family Communication:daughter and friend at bedside.  Disposition Plan:likely to home.  Consults called:None.  Admission status:inpatient.    Antimicrobials: Anti-infectives    None       Subjective:   No complaints.   Objective: Vitals:   12/23/16 1431 12/23/16 1452 12/23/16 2201 12/24/16 0602  BP: (!) 139/56  (!) 122/50 129/62  Pulse: (!) 107 (!) 145 (!) 108 (!) 108  Resp: 20  20 20   Temp: 97.9 F (36.6 C)  99 F (37.2 C) 98.3 F (36.8 C)  TempSrc: Oral  Oral Oral  SpO2: 93% 93% 94% 94%  Weight:      Height:        Intake/Output Summary (Last 24 hours) at 12/24/16 0950 Last data filed at 12/24/16 0602  Gross per 24 hour  Intake          2943.33 ml  Output              500 ml  Net          2443.33 ml   Filed Weights   12/21/16 1723 12/22/16 0535 12/23/16 0449  Weight: 39.5 kg (87 lb 1.3 oz) 41.3 kg (91 lb 0.8 oz) 41.3 kg (90 lb 15.7 oz)    Examination:  General exam: Appears calm and comfortable  Respiratory system: Clear to  auscultation. Respiratory effort normal. Cardiovascular system: S1 & S2 heard, RRR. No JVD, murmurs, rubs, gallops or clicks. No pedal edema. Gastrointestinal system: Abdomen is nondistended, soft and nontender. No organomegaly or masses felt. Normal bowel sounds heard. Central nervous system: Alert and oriented. No focal neurological deficits. Extremities: Symmetric 5 x 5 power. Skin: No rashes, lesions or ulcers Psychiatry: Judgement and insight appear normal. Mood & affect appropriate.   Data Reviewed: I have personally reviewed following labs and imaging studies  CBC:  Recent Labs Lab 12/21/16 1026 12/22/16 0539 12/23/16 0553 12/24/16 0557  WBC 13.3* 9.3 8.0 8.5  NEUTROABS 12.0*  --   --   --   HGB 10.0* 8.3* 8.7* 9.2*  HCT 29.5* 25.0* 25.7* 27.4*  MCV 82.2 82.0 82.4 82.5  PLT 314 296  286 413   Basic Metabolic Panel:  Recent Labs Lab 12/21/16 1026 12/22/16 0539 12/23/16 0553 12/24/16 0557  NA 134* 136 137 137  K 4.1 3.5 3.4* 3.3*  CL 97* 104 106 106  CO2 16* 20* 23 21*  GLUCOSE 147* 92 88 96  BUN 42* 32* 25* 24*  CREATININE 3.12* 2.07* 1.53* 1.47*  CALCIUM 9.5 8.3* 8.1* 7.9*   GFR: Estimated Creatinine Clearance: 18.6 mL/min (A) (by C-G formula based on SCr of 1.47 mg/dL (H)). Liver Function Tests:  Recent Labs Lab 12/22/16 0539  AST 27  ALT 23  ALKPHOS 79  BILITOT 1.3*  PROT 5.7*  ALBUMIN 2.9*   Thyroid Function Tests:  Recent Labs  12/21/16 1806  TSH 0.912   Anemia Panel:  Recent Labs  12/22/16 1057  VITAMINB12 1,147*  FOLATE 4.6*  FERRITIN 433*  TIBC 203*  IRON 24*  RETICCTPCT 1.9   Radiology Studies: US Renal  Result Date: 12/22/2016 CLINICAL DATA:  Acute renal injury EXAM: RENAL / URINARY TRACT ULTRASOUND COMPLETE COMPARISON:  None. FINDINGS: Right Kidney: Length: 8.9 cm. Diffuse increased echogenicity is noted. Simple cysts are noted. The largest of these measures 1.2 cm. No hydronephrosis is noted. Left Kidney: Length: 8.5 cm.  Mild increased echogenicity is noted. Bladder: Appears normal for degree of bladder distention. Note is made of bilateral pleural effusions. IMPRESSION: Right renal cysts. Increased echogenicity of the kidneys consistent with medical renal disease. Bilateral effusions similar to that seen on recent chest x-ray. Electronically Signed   By: Inez Catalina M.D.   On: 12/22/2016 10:34   Ct Hip Right Wo Contrast  Result Date: 12/22/2016 CLINICAL DATA:  Right hip pain. The patient may have fallen yesterday. EXAM: CT OF THE RIGHT HIP WITHOUT CONTRAST TECHNIQUE: Multidetector CT imaging of the right hip was performed according to the standard protocol. Multiplanar CT image reconstructions were also generated. COMPARISON:  None. FINDINGS: Bones/Joint/Cartilage There is a minimally displaced fracture of the right inferior  pubic ramus along with fracture of the right superior pubic ramus at the junction with the acetabulum. There is also a vertical fracture through the right sacral ala. There is also fractures through the right pubic body. The proximal right femur is intact. There is edema in the soft tissues lateral to the right hip which may represent a soft tissue addendum a contusion. IMPRESSION: 1. Fracture of the right sacral ala. 2. Fracture of the right pubic body. 3. Fractures of the right inferior and superior pubic rami. Electronically Signed   By: Lorriane Shire M.D.   On: 12/22/2016 12:26    Scheduled Meds: . aspirin EC  81 mg Oral Daily  . feeding supplement (ENSURE ENLIVE)  237 mL Oral BID BM  . heparin  5,000 Units Subcutaneous Q8H  . lisinopril  2.5 mg Oral Daily  . multivitamin with minerals  1 tablet Oral Daily   Continuous Infusions: . sodium chloride 100 mL/hr at 12/24/16 0650     LOS: 3 days   Naliah Eddington, MD [facility].   If 7PM-7AM, please contact night-coverage [email or web address] Password [email or web address] 12/24/2016, 9:50 AM

## 2016-12-24 NOTE — Progress Notes (Signed)
Physical Therapy Treatment Patient Details Name: BOBBIJO HOLST MRN: [id number] DOB: Jul 02, 1930 Today's Date: 12/24/2016    History of Present Illness Jasmyne Lodato is an 83yo black female who comes to [facility] after sustaining a fall at home. She has had decreased PO intake of food/drink over the past few weeks. While admitted the pt has been tachycardic, with CTM calling once regarding a run of SVT with HR: 150s. PMH: dementia (mild), CKD3, HTN, LungCA s/p resection, and pedal edema. Pt lives alone in a 2-story apartment, bed room on 2nd floor, and has recently started receiving i home aide services 4d/w. She is typically indep in AMB and ADL, but requires some assistance with IADL, housework, transportation, and meals.     PT Comments    Pt received from nursing and was agreeable to PT treatment. Performed supine exercises with mod A for RLE due to pain and weakness. Pt again max A for supine > sit and once EOB, pt vitals were 85% O2 on room air and 125 HR. Pt's vitals improved to 92-93% O2 and HR was around 110 after about a 5 min rest break with proper breathing technique. Pt max A for sit <> stand transfers again this date with RW. PT was going to attempt STP bed > chair but once pt was standing, her vitals were 81% and 136bpm. Sat pt back down on EOB and her numbers improved to 91% O2 after another 5 minute rest break. Assisted pt back to bed and nursing was made aware of pt's vital signs throughout session. Continue to recommend SNF upon d/c due to pt's significant mobility deficits.   Follow Up Recommendations  SNF     Equipment Recommendations   (TBD by receiving facitlity)    Recommendations for Other Services       Precautions / Restrictions Precautions Precautions: Fall Restrictions Weight Bearing Restrictions: No Other Position/Activity Restrictions: WBAT per RN    Mobility  Bed Mobility Overal bed mobility: Needs Assistance Bed Mobility: Supine to Sit;Sit to Supine      Supine to sit: Max assist Sit to supine: Max assist   General bed mobility comments: max A for supine <> sit due to pain and weakness  Transfers Overall transfer level: Needs assistance Equipment used: Rolling walker (2 wheeled) Transfers: Sit to/from Stand Sit to Stand: Max assist         General transfer comment: sit <> stand from EOB while pt maintained RLE extended and did not put full weight through it during the transfer  Ambulation/Gait             General Gait Details: not attempted this date due to increased pain with standing and pt desatted to 81% after sit > stand transfer   Stairs            Wheelchair Mobility    Modified Rankin (Stroke Patients Only)       Balance Overall balance assessment: Needs assistance Sitting-balance support: No upper extremity supported Sitting balance-Leahy Scale: Good Sitting balance - Comments: initial increased posterior trunk lean following supine > sit which required assistance to maintain balance, but once pt shifted her weight forward and got to EOB, her sitting balance was WNL Postural control: Posterior lean Standing balance support: Bilateral upper extremity supported Standing balance-Leahy Scale: Fair Standing balance comment: pt had 1 minor posterior LOB once standing, required min A to recover from  Cognition                                              Exercises General Exercises - Lower Extremity Ankle Circles/Pumps: AROM;Both;Supine;10 reps Long Arc Quad: AAROM;Seated;10 reps;Right (mod A for LLE due to pain and weaness) Heel Slides: AAROM;Supine;10 reps;Right (needs Mod assistance  on Rt with range limited due to pain ) Hip ABduction/ADduction: AAROM;Supine;10 reps;Right (needs Mod assistance  on Rt with range limited due to pain )    General Comments        Pertinent Vitals/Pain Pain Assessment: Faces Faces Pain Scale: Hurts even  more Pain Location: R groin during movement. Pt reported 0/10 pain at beginning and EOS Pain Descriptors / Indicators: Aching Pain Intervention(s): Limited activity within patient's tolerance;Monitored during session;Repositioned    Home Living                      Prior Function            PT Goals (current goals can now be found in the care plan section) Acute Rehab PT Goals Patient Stated Goal: regain independent mobility PT Goal Formulation: With patient Time For Goal Achievement: 01/05/17 Potential to Achieve Goals: Good    Frequency    7X/week      PT Plan      Co-evaluation              AM-PAC PT "6 Clicks" Daily Activity  Outcome Measure  Difficulty turning over in bed (including adjusting bedclothes, sheets and blankets)?: A Lot Difficulty moving from lying on back to sitting on the side of the bed? : Total Difficulty sitting down on and standing up from a chair with arms (e.g., wheelchair, bedside commode, etc,.)?: Total Help needed moving to and from a bed to chair (including a wheelchair)?: Total Help needed walking in hospital room?: Total Help needed climbing 3-5 steps with a railing? : Total 6 Click Score: 7    End of Session Equipment Utilized During Treatment: Gait belt;Other (comment) (RW) Activity Tolerance: Patient tolerated treatment well;Patient limited by pain;Patient limited by fatigue Patient left: in bed;with call bell/phone within reach;with bed alarm set Nurse Communication: Mobility status;Weight bearing status (catheter placement needed; notified of desaturation during session) PT Visit Diagnosis: Other abnormalities of gait and mobility (R26.89);History of falling (Z91.81);Pain Pain - Right/Left: Right Pain - part of body: Hip     Time: 6503-5465 PT Time Calculation (min) (ACUTE ONLY): 36 min  Charges:  $Therapeutic Activity: 23-37 mins                    G Codes:       Geraldine Solar PT, DPT

## 2016-12-25 MED ORDER — ENSURE ENLIVE PO LIQD: 237.0000 mL | Freq: Two times a day (BID) | ORAL | 12 refills | Status: AC

## 2016-12-25 MED ORDER — LISINOPRIL 2.5 MG PO TABS
2.5000 mg | ORAL_TABLET | Freq: Every day | ORAL | 1 refills | Status: DC
Start: 2016-12-26 — End: 2017-01-24

## 2016-12-25 MED ORDER — ASPIRIN 81 MG PO TBEC: 81.0000 mg | DELAYED_RELEASE_TABLET | Freq: Every day | ORAL | 1 refills | Status: AC

## 2016-12-25 MED ORDER — FOLIC ACID 1 MG PO TABS: 1.0000 mg | ORAL_TABLET | Freq: Every day | ORAL | 1 refills | Status: AC

## 2016-12-25 NOTE — Progress Notes (Signed)
Patient has been accepted to [facility] for SNF placement. Patient will transport by family (son has been contacted and will come by and pick patient up) Facility aware of discharge and no barriers for placement today.  No other needs noted at this time. RN aware of plan.  DC to SNF.  Lane Hacker, MSW Clinical Social Work: [employer] Coverage for :  [phone]

## 2016-12-25 NOTE — Clinical Social Work Placement (Addendum)
   CLINICAL SOCIAL WORK PLACEMENT  NOTE  Date:  12/25/2016  Patient Details  Name: Kimberly Vincent MRN: [id number] Date of Birth: 1929-09-22  Clinical Social Work is seeking post-discharge placement for this patient at the [place] level of care (*CSW will initial, date and re-position this form in  chart as items are completed):  Yes   Patient/family provided with [place] Work Department's list of facilities offering this level of care within the geographic area requested by the patient (or if unable, by the patient's family).  Yes   Patient/family informed of their freedom to choose among providers that offer the needed level of care, that participate in Medicare, Medicaid or managed care program needed by the patient, have an available bed and are willing to accept the patient.  Yes   Patient/family informed of [facility]'s ownership interest in [facility] and [facility], as well as of the fact that they are under no obligation to receive care at these facilities.  PASRR submitted to EDS on       PASRR number received on       Existing PASRR number confirmed on 12/25/16     FL2 transmitted to all facilities in geographic area requested by pt/family on 12/25/16     FL2 transmitted to all facilities within larger geographic area on 12/25/16     Patient informed that his/her managed care company has contracts with or will negotiate with certain facilities, including the following:      12/25/2016       Patient/family informed of bed offers received.  12/25/2016   Patient chooses bed at       [facility]  Physician recommends and patient chooses bed at      [facility] Patient to be transferred to   on  .  12/25/2016   Patient to be transferred to facility by      Family will transport.  Patient family notified on   of transfer.  Daughter in law and son:  Joseph Art and Marshell Levan   Name of family member notified:       Family.  PHYSICIAN Please sign FL2     Additional Comment:    _______________________________________________ [name], LCSW 12/25/2016, 11:16 AM

## 2016-12-25 NOTE — Discharge Summary (Signed)
Physician Discharge Summary  CHELCI WINTERMUTE VQQ:[id number] DOB: 07/09/1930 DOA: 12/21/2016  PCP: [facility] date: 12/21/2016 Discharge date: 12/25/2016  Admitted From: Home.  Disposition: to SNF.   Recommendations for Outpatient Follow-up:  1. Follow up with PCP in 1-2 weeks 2. Follow up with Dr Aline Brochure in 6 weeks.   Home Health: No.  To SNF>  Equipment/Devices: SNF. Discharge Condition: Cr normalizing, no hip pain.  CODE STATUS: FULL CODE.  Diet recommendation: Renal diet.   Brief/Interim Summary: Patient was admitted by me on December 21, 2016 for weakness and lower extremity swelling after a fall.  As per my prior H and P:  " MARJORIE DEPREY is an 83 y.o. female with hx of dementia (mild), CKD unclear baseline Cr, HTN on Norvasc, hx of lung cancer s/p resection, lives with daughter at home, brought to the ER after she fell.   She also had failure to thrive, and hadn't been eating or drinking as much.  She has some low back pain, but no palpitation, CP, SOB, fever or chills.  Evaluation in the ER  Showed elevated BUN/Cr to 42/3.12, CXR showed bilateral pleural effusions but no infiltrate or vascular congestion, and doppler of the lower extremities showed no DVT.  Hospitalist was asked to admit her for bilateral pedal edema, AKI on CKD likely from volume depletion, and failure to thrive, weakness and falling.    HOSPITAL COURSE:  Patient was felt to have edema from Norvasc, and that she was having AKI from volume depletion.  She was given IVF, slowly, and with careful checking of her renal Fx.  She actually having improvement of her bilateral edema with IVF, confirming that her pedal edema was from Norvasc. An [place] was done, showing no evidence of DVT.  WIth IVF, her Cr improved and it was down to around 1.5.  [place] of her kidneys showed medical renal disease, no hydronephrosis.  For her anemia, she had no sign of bleeding, and her anemia panel revealed suspicion for anemia of  chronic disease, with low folate level.  She was started on oral folate supplement.  She should also be given nutritional supplements as well.  Due to her murmur found during exam, an ECHO was performed, and it showed aortic slerosis, not stenosis, with mild MR, small effusion, and hyperdynamic LV Fx, with evidence of severe pulmonary HTN and moderate TR.  She was then started on Lisinopril 2.5 mg per day, both for HTN and for her valvular disease.  She had complained of hip pain, and CT of the hip showed pelvis Fx in 3 places. Dr Aline Brochure recommended a follow up x ray in 6 weeks, and I recommended a follow up visit with him then.  She doesn't appear to have too much pain, and she can have WBAT.  She however, no longer can return home, and will be going to a rehab.  Her dementia was mild, and she was continued on her aricept.  She is stable for discharge today.  Thank you for allowing me to participate in her care.  Good Day.   Discharge Diagnoses:  Active Problems:   Failure to thrive in adult   AKI (acute kidney injury) ([place])   Pedal edema   HTN (hypertension)   Dementia   CKD (chronic kidney disease), stage III   Tachycardia   Fall at home, initial encounter   Malnutrition of moderate degree   Discharge Instructions  Discharge Instructions  Diet - low sodium heart healthy    Complete by:  As directed    Increase activity slowly    Complete by:  As directed      Allergies as of 12/25/2016   No Known Allergies     Medication List    STOP taking these medications   amLODipine 10 MG tablet Commonly known as:  NORVASC     TAKE these medications   aspirin 81 MG EC tablet Take 1 tablet (81 mg total) by mouth daily. Start taking on:  12/26/2016   feeding supplement (ENSURE ENLIVE) Liqd Take 237 mLs by mouth 2 (two) times daily between meals.   folic acid 1 MG tablet Commonly known as:  FOLVITE Take 1 tablet (1 mg total) by mouth daily. Start taking on:  12/26/2016    lisinopril 2.5 MG tablet Commonly known as:  PRINIVIL,ZESTRIL Take 1 tablet (2.5 mg total) by mouth daily. Start taking on:  12/26/2016   niacin 750 MG CR tablet Commonly known as:  NIASPAN Take 1 tablet by mouth at bedtime.      Contact information for after-discharge care    [facility] .   Specialty:  [place] information: [address] [place] [phone]             No Known Allergies  Consultations:  Phone consultation with Dr Aline Brochure.   Procedures/Studies: Dg Chest 1 View  Result Date: 12/21/2016 CLINICAL DATA:  Shortness of breath. EXAM: CHEST 1 VIEW COMPARISON:  06/25/2014 FINDINGS: Small pleural effusions. Chronic scar-like opacity over the right base. Status post right upper lobectomy. No air bronchogram or interstitial coarsening. Normal heart size. Stable mediastinal contours with postoperative distortion on the right. Prominent atherosclerotic calcification. IMPRESSION: 1. Small bilateral pleural effusion.  No visible edema or pneumonia. 2. COPD and right upper lobectomy. Electronically Signed   By: Monte Fantasia M.D.   On: 12/21/2016 10:01   US Renal  Result Date: 12/22/2016 CLINICAL DATA:  Acute renal injury EXAM: RENAL / URINARY TRACT ULTRASOUND COMPLETE COMPARISON:  None. FINDINGS: Right Kidney: Length: 8.9 cm. Diffuse increased echogenicity is noted. Simple cysts are noted. The largest of these measures 1.2 cm. No hydronephrosis is noted. Left Kidney: Length: 8.5 cm.  Mild increased echogenicity is noted. Bladder: Appears normal for degree of bladder distention. Note is made of bilateral pleural effusions. IMPRESSION: Right renal cysts. Increased echogenicity of the kidneys consistent with medical renal disease. Bilateral effusions similar to that seen on recent chest x-ray. Electronically Signed   By: Inez Catalina M.D.   On: 12/22/2016 10:34   Ct Hip Right Wo  Contrast  Result Date: 12/22/2016 CLINICAL DATA:  Right hip pain. The patient may have fallen yesterday. EXAM: CT OF THE RIGHT HIP WITHOUT CONTRAST TECHNIQUE: Multidetector CT imaging of the right hip was performed according to the standard protocol. Multiplanar CT image reconstructions were also generated. COMPARISON:  None. FINDINGS: Bones/Joint/Cartilage There is a minimally displaced fracture of the right inferior pubic ramus along with fracture of the right superior pubic ramus at the junction with the acetabulum. There is also a vertical fracture through the right sacral ala. There is also fractures through the right pubic body. The proximal right femur is intact. There is edema in the soft tissues lateral to the right hip which may represent a soft tissue addendum a contusion. IMPRESSION: 1. Fracture of the right sacral ala. 2. Fracture of the right pubic body.  3. Fractures of the right inferior and superior pubic rami. Electronically Signed   By: Lorriane Shire M.D.   On: 12/22/2016 12:26   US Venous Img Lower Bilateral  Result Date: 12/21/2016 CLINICAL DATA:  Bilateral lower extremity edema EXAM: BILATERAL LOWER EXTREMITY VENOUS DUPLEX ULTRASOUND TECHNIQUE: Gray-scale sonography with graded compression, as well as color Doppler and duplex ultrasound were performed to evaluate the lower extremity deep venous systems from the level of the common femoral vein and including the common femoral, femoral, profunda femoral, popliteal and calf veins including the posterior tibial, peroneal and gastrocnemius veins when visible. The superficial great saphenous vein was also interrogated. Spectral Doppler was utilized to evaluate flow at rest and with distal augmentation maneuvers in the common femoral, femoral and popliteal veins. COMPARISON:  None. FINDINGS: RIGHT LOWER EXTREMITY Common Femoral Vein: No evidence of thrombus. Normal compressibility, respiratory phasicity and response to augmentation.  Saphenofemoral Junction: No evidence of thrombus. Normal compressibility and flow on color Doppler imaging. Profunda Femoral Vein: No evidence of thrombus. Normal compressibility and flow on color Doppler imaging. Femoral Vein: No evidence of thrombus. Normal compressibility, respiratory phasicity and response to augmentation. Popliteal Vein: No evidence of thrombus. Normal compressibility, respiratory phasicity and response to augmentation. Calf Veins: No evidence of thrombus. Normal compressibility and flow on color Doppler imaging. Superficial Great Saphenous Vein: No evidence of thrombus. Normal compressibility and flow on color Doppler imaging. Venous Reflux:  None. Other Findings:  None. LEFT LOWER EXTREMITY Common Femoral Vein: No evidence of thrombus. Normal compressibility, respiratory phasicity and response to augmentation. Saphenofemoral Junction: No evidence of thrombus. Normal compressibility and flow on color Doppler imaging. Profunda Femoral Vein: No evidence of thrombus. Normal compressibility and flow on color Doppler imaging. Femoral Vein: No evidence of thrombus. Normal compressibility, respiratory phasicity and response to augmentation. Popliteal Vein: No evidence of thrombus. Normal compressibility, respiratory phasicity and response to augmentation. Calf Veins: No evidence of thrombus. Normal compressibility and flow on color Doppler imaging. Superficial Great Saphenous Vein: No evidence of thrombus. Normal compressibility and flow on color Doppler imaging. Venous Reflux:  None. Other Findings:  There is soft tissue edema bilaterally. IMPRESSION: No evidence of deep venous thrombosis in either lower extremity. There is soft tissue edema in both lower extremities. Electronically Signed   By: Lowella Grip III M.D.   On: 12/21/2016 13:27     Subjective: No complaints.    Discharge Exam: Vitals:   12/24/16 2100 12/25/16 0419  BP: (!) 146/72 (!) 153/69  Pulse: (!) 122 (!) 112  Resp:  20 20  Temp: 98.6 F (37 C) 98.6 F (37 C)   Vitals:   12/24/16 1141 12/24/16 1500 12/24/16 2100 12/25/16 0419  BP:  118/73 (!) 146/72 (!) 153/69  Pulse: (!) 136 (!) 106 (!) 122 (!) 112  Resp:  20 20 20   Temp:  97.8 F (36.6 C) 98.6 F (37 C) 98.6 F (37 C)  TempSrc:  Oral Oral Oral  SpO2: (!) 81% 95% 90% 99%  Weight:    46.2 kg (101 lb 13.6 oz)  Height:        General: Pt is alert, awake, not in acute distress Cardiovascular: RRR, S1/S2 +, no rubs, no gallops Respiratory: CTA bilaterally, no wheezing, no rhonchi Abdominal: Soft, NT, ND, bowel sounds + Extremities: no edema, no cyanosis    The results of significant diagnostics from this hospitalization (including imaging, microbiology, ancillary and laboratory) are listed below for reference.    Labs: BNP (last 3  results)  Recent Labs  12/21/16 1026  BNP 774.1*   Basic Metabolic Panel:  Recent Labs Lab 12/21/16 1026 12/22/16 0539 12/23/16 0553 12/24/16 0557  NA 134* 136 137 137  K 4.1 3.5 3.4* 3.3*  CL 97* 104 106 106  CO2 16* 20* 23 21*  GLUCOSE 147* 92 88 96  BUN 42* 32* 25* 24*  CREATININE 3.12* 2.07* 1.53* 1.47*  CALCIUM 9.5 8.3* 8.1* 7.9*   Liver Function Tests:  Recent Labs Lab 12/22/16 0539  AST 27  ALT 23  ALKPHOS 79  BILITOT 1.3*  PROT 5.7*  ALBUMIN 2.9*   CBC:  Recent Labs Lab 12/21/16 1026 12/22/16 0539 12/23/16 0553 12/24/16 0557  WBC 13.3* 9.3 8.0 8.5  NEUTROABS 12.0*  --   --   --   HGB 10.0* 8.3* 8.7* 9.2*  HCT 29.5* 25.0* 25.7* 27.4*  MCV 82.2 82.0 82.4 82.5  PLT 314 296 286 305   Urinalysis    Component Value Date/Time   COLORURINE YELLOW 12/21/2016 1355   APPEARANCEUR CLOUDY (A) 12/21/2016 1355   LABSPEC 1.013 12/21/2016 1355   PHURINE 5.0 12/21/2016 1355   GLUCOSEU 50 (A) 12/21/2016 1355   HGBUR MODERATE (A) 12/21/2016 1355   BILIRUBINUR NEGATIVE 12/21/2016 1355   KETONESUR 5 (A) 12/21/2016 1355   PROTEINUR 100 (A) 12/21/2016 1355   NITRITE NEGATIVE  12/21/2016 1355   LEUKOCYTESUR SMALL (A) 12/21/2016 1355   The coordinating discharge: Over 30 minutes SIGNED:  Orvan Falconer, MD FACP [facility] 12/25/2016, 1:39 PM   If 7PM-7AM, please contact night-coverage [email or web address] Password [email or web address]

## 2016-12-25 NOTE — NC FL2 (Signed)
[place] LEVEL OF CARE SCREENING TOOL     IDENTIFICATION  Patient Name: Kimberly Vincent Birthdate: [birth year] Sex: female Admission Date (Current Location): 12/21/2016  [facility] and [place] Number:  [employer] and Address:  [place] [address] [place]      Provider Number: [id number]  Attending Physician Name and Address:  Orvan Falconer, MD  Relative Name and Phone Number:       Current Level of Care: Hospital Recommended Level of Care: [place] Prior Approval Number:    Date Approved/Denied:   PASRR Number:  [id number]  Discharge Plan: SNF    Current Diagnoses: Patient Active Problem List   Diagnosis Date Noted  . Malnutrition of moderate degree 12/23/2016  . Failure to thrive in adult 12/21/2016  . AKI (acute kidney injury) ([place]) 12/21/2016  . Pedal edema 12/21/2016  . HTN (hypertension) 12/21/2016  . Dementia 12/21/2016  . CKD (chronic kidney disease), stage III 12/21/2016  . Tachycardia 12/21/2016  . Fall at home, initial encounter 12/21/2016    Orientation RESPIRATION BLADDER Height & Weight     Self, Situation, Place  Normal Incontinent Weight: 101 lb 13.6 oz (46.2 kg) Height:  5\' 3"  (160 cm)  BEHAVIORAL SYMPTOMS/MOOD NEUROLOGICAL BOWEL NUTRITION STATUS      Incontinent Diet (See DC Summary)  AMBULATORY STATUS COMMUNICATION OF NEEDS Skin   Extensive Assist Verbally Normal                       Personal Care Assistance Level of Assistance  Bathing, Feeding, Dressing Bathing Assistance: Limited assistance Feeding assistance: Limited assistance Dressing Assistance: Limited assistance     Functional Limitations Info  Sight, Hearing, Speech Sight Info: Adequate Hearing Info: Adequate Speech Info: Adequate    SPECIAL CARE FACTORS FREQUENCY  PT (By licensed PT), OT (By licensed OT)     PT Frequency: 5x OT Frequency: 5x            Contractures Contractures Info: Not  present    Additional Factors Info  Code Status, Allergies Code Status Info: Full Code Allergies Info: NKA           Current Medications (12/25/2016):  This is the current hospital active medication list Current Facility-Administered Medications  Medication Dose Route Frequency Provider Last Rate Last Dose  . 0.9 %  sodium chloride infusion   Intravenous Continuous Orvan Falconer, MD 100 mL/hr at 12/25/16 0211    . aspirin EC tablet 81 mg  81 mg Oral Daily Orvan Falconer, MD   81 mg at 12/25/16 1050  . feeding supplement (ENSURE ENLIVE) (ENSURE ENLIVE) liquid 237 mL  237 mL Oral BID BM Orvan Falconer, MD   237 mL at 12/25/16 1050  . folic acid (FOLVITE) tablet 1 mg  1 mg Oral Daily Orvan Falconer, MD   1 mg at 12/25/16 1050  . heparin injection 5,000 Units  5,000 Units Subcutaneous Q8H Orvan Falconer, MD   5,000 Units at 12/25/16 0533  . lisinopril (PRINIVIL,ZESTRIL) tablet 2.5 mg  2.5 mg Oral Daily Orvan Falconer, MD   2.5 mg at 12/25/16 1050  . multivitamin with minerals tablet 1 tablet  1 tablet Oral Daily Orvan Falconer, MD   1 tablet at 12/25/16 1050  . ondansetron (ZOFRAN) tablet 4 mg  4 mg Oral Q6H PRN Orvan Falconer, MD       Or  . ondansetron [facility]) injection 4 mg  4 mg Intravenous  Q6H PRN Orvan Falconer, MD         Discharge Medications: Please see discharge summary for a list of discharge medications.  Relevant Imaging Results:  Relevant Lab Results:   Additional Information SSN: [id number]  [name], [place]

## 2016-12-25 NOTE — Care Management Note (Signed)
Case Management Note  Patient Details  Name: Kimberly Vincent MRN: [id number] Date of Birth: 18-Dec-1929  Expected Discharge Date:      12/25/2016            Expected Discharge Plan:  [place]  In-House Referral:  Clinical Social Work  Discharge planning Services  CM Consult  Post Acute Care Choice:  NA Choice offered to:  NA  Status of Service:  Completed, signed off  Additional Comments: DC to SNF today. CSW making arrangements for placement. No CM needs.   Sherald Barge, RN 12/25/2016, 12:45 PM

## 2016-12-25 NOTE — Progress Notes (Addendum)
Discharged to [facility] in stable condition, out via w/c with staff, report given to Myles Lipps, LPN.

## 2016-12-25 NOTE — Care Management Important Message (Signed)
Important Message  Patient Details  Name: Kimberly Vincent MRN: [id number] Date of Birth: January 18, 1930   Medicare Important Message Given:  Yes    Sherald Barge, RN 12/25/2016, 12:44 PM

## 2016-12-25 NOTE — Progress Notes (Signed)
Patient ID: Kimberly Vincent, female   DOB: [birth year], 82 y.o.   MRN: [id number]   Orthopedic surgical opinion  Patient is a right pelvic fracture which is probably lateral compression type injury  As long as her hemoglobin is stable she can start weightbearing as tolerated protocol  She will need an x-ray in 6 weeks  If necessary please give me a call [phone] if you want me to actually see the patient.

## 2016-12-25 NOTE — Clinical Social Work Note (Signed)
Clinical Social Work Assessment  Patient Details  Name: Kimberly Vincent MRN: [id number] Date of Birth: [birth year]  Date of referral:  12/25/16               Reason for consult:  Discharge Planning, Facility Placement                Permission sought to share information with:  Case Manager, Customer service manager, Family Supports Permission granted to share information::  Yes, Verbal Permission Granted  Name::        Agency::     Relationship::  son or daughter  Contact Information:     Housing/Transportation Living arrangements for the past 2 months:  Tax adviser of Information:  Patient, Scientist, water quality, Tourist information centre manager, Facility, Adult Children Patient Interpreter Needed:  None Criminal Activity/Legal Involvement Pertinent to Current Situation/Hospitalization:  No - Comment as needed Significant Relationships:  Adult Children, Other Family Members Lives with:  Self Do you feel safe going back to the place where you live?  No Need for family participation in patient care:  Yes (Comment)  Care giving concerns:  Pt lives alone in a 2-story apartment, bed room on 2nd floor, and has recently started receiving in home aide services 4d/w. She is typically indep in AMB and ADL, but requires some assistance with IADL, housework, transportation, and meals.   Patient reports she has completed SNF in the past (appears to be in 2012), but does not know where she went.  Due to decondition in strength and continued physical therapy patient has been recommended SNF and she is agreeable. She gives permission to speak with adult children regarding placement.    Social Worker assessment / plan:  LCSW consulted for SNF. Work up completed. Will follow up with bed offers.  Family to be contacted and updated regarding placement and offers.  Will continue to follow and assist with disposition.  [place] and Fl2 completed  Plan: SNF at discharge    Employment status:  Retired Designer, industrial/product PT Recommendations:  [place] / Referral to community resources:  [place]  Patient/Family's Response to care:  Appears to understand, does not have many questions. Will defer to family for assistance with placement. No one at bedside during assessment.  Patient/Family's Understanding of and Emotional Response to Diagnosis, Current Treatment, and Prognosis:  Not at this time.  Patient aware of where she is, but limited in ability to report history, any previous placement, and current reason for admission.    Emotional Assessment Appearance:  Appears stated age Attitude/Demeanor/Rapport:    Affect (typically observed):  Accepting, Adaptable, Pleasant Orientation:  Oriented to Self, Oriented to Place, Oriented to Situation Alcohol / Substance use:  Not Applicable Psych involvement (Current and /or in the community):  No (Comment)  Discharge Needs  Concerns to be addressed:  No discharge needs identified Readmission within the last 30 days:  No Current discharge risk:  None Barriers to Discharge:  No Barriers Identified, Continued Medical Work up   [name], LCSW 12/25/2016, 10:58 AM

## 2017-01-16 ENCOUNTER — Emergency Department (HOSPITAL_COMMUNITY)
Admission: EM | Admit: 2017-01-16 | Discharge: 2017-01-16 | Disposition: A | Discharge status: Home / Self Care | Payer: Medicare Other | Attending: Emergency Medicine | Admitting: Emergency Medicine

## 2017-01-16 ENCOUNTER — Emergency Department (HOSPITAL_COMMUNITY): Payer: Medicare Other

## 2017-01-16 ENCOUNTER — Encounter (HOSPITAL_COMMUNITY): Payer: Self-pay | Admitting: Emergency Medicine

## 2017-01-16 DIAGNOSIS — N289 Disorder of kidney and ureter, unspecified: Secondary | ICD-10-CM

## 2017-01-16 DIAGNOSIS — R002 Palpitations: Secondary | ICD-10-CM | POA: Diagnosis present

## 2017-01-16 DIAGNOSIS — Z7982 Long term (current) use of aspirin: Secondary | ICD-10-CM | POA: Insufficient documentation

## 2017-01-16 DIAGNOSIS — D649 Anemia, unspecified: Secondary | ICD-10-CM | POA: Diagnosis not present

## 2017-01-16 DIAGNOSIS — N183 Chronic kidney disease, stage 3 (moderate): Secondary | ICD-10-CM | POA: Insufficient documentation

## 2017-01-16 DIAGNOSIS — Z79899 Other long term (current) drug therapy: Secondary | ICD-10-CM | POA: Insufficient documentation

## 2017-01-16 DIAGNOSIS — C3490 Malignant neoplasm of unspecified part of unspecified bronchus or lung: Secondary | ICD-10-CM | POA: Insufficient documentation

## 2017-01-16 DIAGNOSIS — I129 Hypertensive chronic kidney disease with stage 1 through stage 4 chronic kidney disease, or unspecified chronic kidney disease: Secondary | ICD-10-CM | POA: Diagnosis not present

## 2017-01-16 LAB — URINALYSIS, ROUTINE W REFLEX MICROSCOPIC
Bilirubin Urine: NEGATIVE
GLUCOSE, UA: NEGATIVE mg/dL
HGB URINE DIPSTICK: NEGATIVE
Ketones, ur: NEGATIVE mg/dL
Leukocytes, UA: NEGATIVE
NITRITE: NEGATIVE
PH: 5 (ref 5.0–8.0)
Specific Gravity, Urine: 1.016 (ref 1.005–1.030)

## 2017-01-16 LAB — CBC WITH DIFFERENTIAL/PLATELET
Basophils Absolute: 0 10*3/uL (ref 0.0–0.1)
Basophils Relative: 0 %
EOS PCT: 5 %
Eosinophils Absolute: 0.6 10*3/uL (ref 0.0–0.7)
HEMATOCRIT: 28.2 % — AB (ref 36.0–46.0)
Hemoglobin: 9.1 g/dL — ABNORMAL LOW (ref 12.0–15.0)
LYMPHS ABS: 1.6 10*3/uL (ref 0.7–4.0)
LYMPHS PCT: 14 %
MCH: 28.1 pg (ref 26.0–34.0)
MCHC: 32.3 g/dL (ref 30.0–36.0)
MCV: 87 fL (ref 78.0–100.0)
MONO ABS: 0.3 10*3/uL (ref 0.1–1.0)
MONOS PCT: 2 %
Neutro Abs: 9.4 10*3/uL — ABNORMAL HIGH (ref 1.7–7.7)
Neutrophils Relative %: 79 %
PLATELETS: 529 10*3/uL — AB (ref 150–400)
RBC: 3.24 MIL/uL — ABNORMAL LOW (ref 3.87–5.11)
RDW: 16.5 % — AB (ref 11.5–15.5)
WBC: 11.9 10*3/uL — ABNORMAL HIGH (ref 4.0–10.5)

## 2017-01-16 LAB — BASIC METABOLIC PANEL
Anion gap: 12 (ref 5–15)
BUN: 17 mg/dL (ref 6–20)
CHLORIDE: 104 mmol/L (ref 101–111)
CO2: 24 mmol/L (ref 22–32)
Calcium: 8.6 mg/dL — ABNORMAL LOW (ref 8.9–10.3)
Creatinine, Ser: 1.74 mg/dL — ABNORMAL HIGH (ref 0.44–1.00)
GFR calc Af Amer: 30 mL/min — ABNORMAL LOW (ref >60)
GFR, EST NON AFRICAN AMERICAN: 26 mL/min — AB (ref >60)
GLUCOSE: 102 mg/dL — AB (ref 65–99)
POTASSIUM: 3.9 mmol/L (ref 3.5–5.1)
Sodium: 140 mmol/L (ref 135–145)

## 2017-01-16 LAB — TROPONIN I: Troponin I: <0.03 ng/mL (ref <0.03)

## 2017-01-16 LAB — D-DIMER, QUANTITATIVE: D-Dimer, Quant: 4.2 ug/mL-FEU — ABNORMAL HIGH (ref 0.00–0.50)

## 2017-01-16 MED ORDER — IOPAMIDOL (ISOVUE-370) INJECTION 76%
64.0000 mL | Freq: Once | INTRAVENOUS | Status: AC | PRN
  Administered 2017-01-16: 64 mL via INTRAVENOUS

## 2017-01-16 MED ORDER — ONDANSETRON HCL 4 MG/2ML IJ SOLN
INTRAMUSCULAR | Status: AC
  Filled 2017-01-16: qty 2

## 2017-01-16 NOTE — ED Provider Notes (Signed)
[facility] DEPT Provider Note   CSN: [id number] Arrival date & time: 01/16/17  0023     History   Chief Complaint Chief Complaint  Patient presents with  . Palpitations    HPI Kimberly Vincent is a 83 y.o. female.  The history is provided by the patient.  She is a resident at a nursing home where she was noted to have her heart racing. Patient does remember feeling like her heart is racing, but it is not racing now. She denies chest pain, dyspnea, nausea. She is currently in the nursing home because of a pelvis fracture.  Past Medical History:  Diagnosis Date  . Chronic kidney disease   . Hypertension   . Lung cancer [facility])     Patient Active Problem List   Diagnosis Date Noted  . Malnutrition of moderate degree 12/23/2016  . Failure to thrive in adult 12/21/2016  . AKI (acute kidney injury) ([place]) 12/21/2016  . Pedal edema 12/21/2016  . HTN (hypertension) 12/21/2016  . Dementia 12/21/2016  . CKD (chronic kidney disease), stage III 12/21/2016  . Tachycardia 12/21/2016  . Fall at home, initial encounter 12/21/2016    Past Surgical History:  Procedure Laterality Date  . ABDOMINAL HYSTERECTOMY    . LOBECTOMY      OB History    No data available       Home Medications    Prior to Admission medications   Medication Sig Start Date End Date Taking? Authorizing Provider  aspirin EC 81 MG EC tablet Take 1 tablet (81 mg total) by mouth daily. 12/26/16   Orvan Falconer, MD  feeding supplement, ENSURE ENLIVE, (ENSURE ENLIVE) LIQD Take 237 mLs by mouth 2 (two) times daily between meals. 12/25/16   Orvan Falconer, MD  folic acid (FOLVITE) 1 MG tablet Take 1 tablet (1 mg total) by mouth daily. 12/26/16   Orvan Falconer, MD  lisinopril (PRINIVIL,ZESTRIL) 2.5 MG tablet Take 1 tablet (2.5 mg total) by mouth daily. 12/26/16   Orvan Falconer, MD  niacin (NIASPAN) 750 MG CR tablet Take 1 tablet by mouth at bedtime. 09/27/16   [provider]    Family History No family history on  file.  Social History Social History  Substance Use Topics  . Smoking status: Never Smoker  . Smokeless tobacco: Never Used  . Alcohol use No     Allergies   Patient has no known allergies.   Review of Systems Review of Systems  All other systems reviewed and are negative.    Physical Exam Updated Vital Signs BP (!) 144/79 (BP Location: Left Arm)   Pulse (!) 104   Temp 98.8 F (37.1 C) (Oral)   Resp (!) 28   Wt 45.8 kg (101 lb)   SpO2 99%   BMI 17.89 kg/m   Physical Exam  Nursing note and vitals reviewed.  83 year old female, resting comfortably and in no acute distress. Vital signs are significant for tachypnea, borderline tachycardia, borderline hypertension. Oxygen saturation is 99%, which is normal. Head is normocephalic and atraumatic. PERRLA, EOMI. Oropharynx is clear. Neck is nontender and supple without adenopathy or JVD. Back is nontender and there is no CVA tenderness. Lungs are clear without rales, wheezes, or rhonchi. Chest is nontender. Heart has regular rate and rhythm without murmur. Abdomen is soft, flat, nontender without masses or hepatosplenomegaly and peristalsis is normoactive. Extremities have no cyanosis or edema, full range of motion is present. Skin is warm and dry without rash. Neurologic: Mental status  is normal, cranial nerves are intact, there are no motor or sensory deficits.  ED Treatments / Results  Labs (all labs ordered are listed, but only abnormal results are displayed) Labs Reviewed  URINALYSIS, ROUTINE W REFLEX MICROSCOPIC - Abnormal; Notable for the following:       Result Value   APPearance HAZY (*)    Protein, ur >=300 (*)    Bacteria, UA RARE (*)    Squamous Epithelial / LPF 0-5 (*)    All other components within normal limits  BASIC METABOLIC PANEL - Abnormal; Notable for the following:    Glucose, Bld 102 (*)    Creatinine, Ser 1.74 (*)    Calcium 8.6 (*)    GFR calc non Af Amer 26 (*)    GFR calc Af Amer 30  (*)    All other components within normal limits  CBC WITH DIFFERENTIAL/PLATELET - Abnormal; Notable for the following:    WBC 11.9 (*)    RBC 3.24 (*)    Hemoglobin 9.1 (*)    HCT 28.2 (*)    RDW 16.5 (*)    Platelets 529 (*)    Neutro Abs 9.4 (*)    All other components within normal limits  D-DIMER, QUANTITATIVE (NOT AT [facility]) - Abnormal; Notable for the following:    D-Dimer, Quant 4.20 (*)    All other components within normal limits  TROPONIN I    EKG  EKG Interpretation  Date/Time:  Tuesday January 16 2017 00:39:15 EDT Ventricular Rate:  100 PR Interval:    QRS Duration: 87 QT Interval:  346 QTC Calculation: 447 R Axis:   70 Text Interpretation:  Sinus tachycardia Consider left ventricular hypertrophy Borderline T abnormalities, lateral leads When compared with ECG of 06/14/2011, Nonspecific T wave abnormality is now present Confirmed by Delora Fuel ([id number]) on 01/16/2017 1:09:03 AM       Radiology No results found.  Procedures Procedures (including critical care time)  Medications Ordered in ED Medications  ondansetron (ZOFRAN) 4 MG/2ML injection (not administered)  iopamidol (ISOVUE-370) 76 % injection 64 mL (64 mLs Intravenous Contrast Given 01/16/17 0447)     Initial Impression / Assessment and Plan / ED Course  I have reviewed the triage vital signs and the nursing notes.  Pertinent labs & imaging results that were available during my care of the patient were reviewed by me and considered in my medical decision making (see chart for details).  Episode of palpitations which has resolved. Currently, borderline sinus tachycardia. Family does relate recent history of pneumonia. Will check chest x-ray and urinalysis to look for occult infection, screen for possible pulmonary embolism with d-dimer. ED workup is negative, she probably should have outpatient cardiac monitoring. Old records are reviewed, and she has no relevant past visits.  Laboratory workup did show  slight worsening of renal function. D-dimer is significantly elevated. Mild to moderate anemia present which is unchanged from baseline. Troponin is negative, but d-dimer is elevated. She is sent for CT angiogram which shows no evidence of pulmonary embolism. Cause for her palpitations is unclear. She is discharged with ambulatory referral to cardiology to consider Holter monitor or event monitor.  Final Clinical Impressions(s) / ED Diagnoses   Final diagnoses:  Palpitations  Renal insufficiency  Normochromic normocytic anemia    New Prescriptions New Prescriptions   No medications on file     Delora Fuel, MD 35/46/56 [phone]

## 2017-01-16 NOTE — Discharge Instructions (Signed)
Your evaluation in the emergency department did not show a reason for your palpitations. Please make an appointment with the cardiologist. They may choose to do an extended heart monitor to try to find out what the heart rhythm was. Here kidney function is a little worse than it had been. Please make a follow-up appointment with your primary care provider to recheck your kidney tests in one week.

## 2017-01-16 NOTE — ED Triage Notes (Signed)
Pt c/o fast heart rate and not feeling well the last couple of days.

## 2017-01-24 ENCOUNTER — Other Ambulatory Visit: Payer: Self-pay | Admitting: *Deleted

## 2017-01-24 ENCOUNTER — Ambulatory Visit (INDEPENDENT_AMBULATORY_CARE_PROVIDER_SITE_OTHER): Payer: Medicare Other | Admitting: Cardiology

## 2017-01-24 ENCOUNTER — Encounter: Payer: Self-pay | Admitting: Cardiology

## 2017-01-24 VITALS — BP 138/40 | HR 122 | Wt 92.0 lb

## 2017-01-24 DIAGNOSIS — N183 Chronic kidney disease, stage 3 unspecified: Secondary | ICD-10-CM

## 2017-01-24 DIAGNOSIS — I10 Essential (primary) hypertension: Secondary | ICD-10-CM | POA: Diagnosis not present

## 2017-01-24 DIAGNOSIS — I272 Pulmonary hypertension, unspecified: Secondary | ICD-10-CM | POA: Insufficient documentation

## 2017-01-24 DIAGNOSIS — R627 Adult failure to thrive: Secondary | ICD-10-CM | POA: Diagnosis not present

## 2017-01-24 DIAGNOSIS — R000 Tachycardia, unspecified: Secondary | ICD-10-CM | POA: Diagnosis not present

## 2017-01-24 DIAGNOSIS — F0150 Vascular dementia without behavioral disturbance: Secondary | ICD-10-CM

## 2017-01-24 DIAGNOSIS — S329XXA Fracture of unspecified parts of lumbosacral spine and pelvis, initial encounter for closed fracture: Secondary | ICD-10-CM

## 2017-01-24 MED ORDER — DILTIAZEM HCL ER COATED BEADS 120 MG PO CP24: 120.0000 mg | ORAL_CAPSULE | Freq: Every day | ORAL | 3 refills | Status: AC

## 2017-01-24 NOTE — Addendum Note (Signed)
Addended by: Baldomero Lamy B on: 01/24/2017 11:31 AM   Modules accepted: Orders

## 2017-01-24 NOTE — Patient Instructions (Addendum)
Your physician recommends that you schedule a follow-up appointment in: 2-3 Weeks   Your physician has recommended you make the following change in your medication:  Stop Taking : Mevacor, Niacin, Norvasc   Start taking Diltiazem 120 mg Daily   If you need a refill on your cardiac medications before your next appointment, please call your pharmacy.   Thank you for choosing [facility]!

## 2017-01-24 NOTE — Assessment & Plan Note (Signed)
92 lbs

## 2017-01-24 NOTE — Assessment & Plan Note (Signed)
Pleasant but unable to give any medical history

## 2017-01-24 NOTE — Assessment & Plan Note (Signed)
Controlled.  

## 2017-01-24 NOTE — Assessment & Plan Note (Signed)
Referred from ED. Pt noted to have increased HR at the nursing and was sent to the ED 01/16/17 EKG showed sinus tach-100. Its not clear that she is symptomatic with this. She is cachectic, 92 lbs, on O2 for COPD.

## 2017-01-24 NOTE — Progress Notes (Signed)
01/24/2017 Kimberly Vincent   1930-03-07  [id number]  Primary Physician [facility] Primary Cardiologist: [facility] ([phone])  HPI:  83 y/o AA female (looks older) with a history of HTN, COPD, failure to thrive, and mild dementia. She was admitted in June after a fall at home and suffered a fractured hip. This was treated conservatively. She was discharged to a SNF. Echo then showed pulmonary HTN and an EF of 70-75%. She was sent to the ED 01/16/17 when it was noted that she was tachycardic at the nursing home. It's not clear if she was symptomatic, the pt can't tell me. She was referred for cardiac evlauation and consideration for OP monitoring by the ED physician. History obtained from the pt's grandson (who works at [employer]) indicates no prior cardiac problems or evaluations. No chest pain. No symptomatic tachycardia.    Current Outpatient Prescriptions  Medication Sig Dispense Refill  . amLODipine (NORVASC) 10 MG tablet     . aspirin EC 81 MG EC tablet Take 1 tablet (81 mg total) by mouth daily. 30 tablet 1  . cholecalciferol (VITAMIN D) 1000 units tablet Take 2,000 Units by mouth daily.    Marland Kitchen donepezil (ARICEPT) 5 MG tablet Take 5 mg by mouth at bedtime.    . feeding supplement, ENSURE ENLIVE, (ENSURE ENLIVE) LIQD Take 237 mLs by mouth 2 (two) times daily between meals. 213 mL 12  . folic acid (FOLVITE) 1 MG tablet Take 1 tablet (1 mg total) by mouth daily. 30 tablet 1  . lovastatin (MEVACOR) 10 MG tablet Take 10 mg by mouth at bedtime.    . metoprolol tartrate (LOPRESSOR) 25 MG tablet Take 12.5 mg by mouth 2 (two) times daily.    . niacin (NIASPAN) 750 MG CR tablet Take 1 tablet by mouth at bedtime.    . pantoprazole (PROTONIX) 40 MG tablet Take 40 mg by mouth daily.    . polyethylene glycol (MIRALAX / GLYCOLAX) packet Take 17 g by mouth daily.    . sennosides-docusate sodium (SENOKOT-S) 8.6-50 MG tablet Take 1 tablet by mouth daily.    . traMADol (ULTRAM) 50 MG tablet Take  by mouth every 6 (six) hours as needed.     No current facility-administered medications for this visit.     No Known Allergies  Past Medical History:  Diagnosis Date  . Chronic kidney disease   . Hypertension   . Lung cancer [facility])     Social History   Social History  . Marital status: Single    Spouse name: N/A  . Number of children: N/A  . Years of education: N/A   Occupational History  . Not on file.   Social History Main Topics  . Smoking status: Never Smoker  . Smokeless tobacco: Never Used  . Alcohol use No  . Drug use: No  . Sexual activity: Not on file   Other Topics Concern  . Not on file   Social History Narrative  . No narrative on file     History reviewed. No FM Hx of CAD   Review of Systems: General: negative for chills, fever, night sweats or weight changes.  Cardiovascular: negative for chest pain, dyspnea on exertion, edema, orthopnea, palpitations, paroxysmal nocturnal dyspnea or shortness of breath Dermatological: negative for rash Respiratory: negative for cough or wheezing Urologic: negative for hematuria Abdominal: negative for nausea, vomiting, diarrhea, bright red blood per rectum, melena, or hematemesis Neurologic: negative for visual changes, syncope, or dizziness  All other systems reviewed and are otherwise negative except as noted above.    Blood pressure (!) 138/40, pulse (!) 122, weight 92 lb (41.7 kg), SpO2 97 %.  General appearance: alert, cooperative, cachectic and no distress Neck: no carotid bruit and no JVD Lungs: clear to auscultation bilaterally and decreased breath sounds Heart: regular rate and rhythm Abdomen: soft, non-tender; bowel sounds normal; no masses,  no organomegaly Extremities: no edema Pulses: diminnished Skin: Skin color, texture, turgor normal. No rashes or lesions Neurologic: Grossly normal  EKG today- NSR HR 110, LVH by voltage  ASSESSMENT AND PLAN:   Tachycardia Referred from ED. Pt noted to  have increased HR at the nursing and was sent to the ED 01/16/17 EKG showed sinus tach-100. Its not clear that she is symptomatic with this. She is cachectic, 92 lbs, on O2 for COPD.  Dementia Pleasant but unable to give any medical history  HTN (hypertension) Controlled  Failure to thrive in adult 92 lbs  CKD (chronic kidney disease), stage III CRI 3B- GFR 30  Pulmonary hypertension (HCC) PA 73 mmHg, EF 70-75%, AOV sclerosis by echo June 2018   PLAN  I suggested we change her Norvasc to Diltiazem as it may have more chronotropic affect than Amlodipine. I would not add a beta blocker or get a monitor at this point as she appears to be asymptomatic and her high resting HR is probably secondary to chronic debilitation, COPD, and malnutrition. TSH in June 2018 was WNL. F/U with a cardiologist in 2-3 weeks.   She will now be on Diltiazem and there is a warning for interaction with Mevacor. She is also on Niacin. I don't think these medications are appropriate in her current state and I have taken the liberty of stopping them.   Kerin Ransom PA-C 01/24/2017 2:06 PM

## 2017-01-24 NOTE — Assessment & Plan Note (Signed)
CRI 3B- GFR 30

## 2017-01-24 NOTE — Assessment & Plan Note (Signed)
PA 73 mmHg, EF 70-75%, AOV sclerosis by echo June 2018

## 2017-01-25 ENCOUNTER — Other Ambulatory Visit: Payer: Medicare Other

## 2017-02-06 ENCOUNTER — Ambulatory Visit (INDEPENDENT_AMBULATORY_CARE_PROVIDER_SITE_OTHER): Payer: Self-pay | Admitting: Orthopedic Surgery

## 2017-02-06 ENCOUNTER — Encounter: Payer: Self-pay | Admitting: Orthopedic Surgery

## 2017-02-06 ENCOUNTER — Ambulatory Visit (HOSPITAL_COMMUNITY)
Admission: RE | Admit: 2017-02-06 | Discharge: 2017-02-06 | Disposition: A | Discharge status: Home / Self Care | Payer: Medicare Other | Source: Ambulatory Visit | Attending: Orthopedic Surgery | Admitting: Orthopedic Surgery

## 2017-02-06 VITALS — BP 125/68 | HR 94

## 2017-02-06 DIAGNOSIS — S329XXA Fracture of unspecified parts of lumbosacral spine and pelvis, initial encounter for closed fracture: Secondary | ICD-10-CM

## 2017-02-06 DIAGNOSIS — S32591D Other specified fracture of right pubis, subsequent encounter for fracture with routine healing: Secondary | ICD-10-CM

## 2017-02-06 DIAGNOSIS — M8580 Other specified disorders of bone density and structure, unspecified site: Secondary | ICD-10-CM | POA: Diagnosis not present

## 2017-02-06 DIAGNOSIS — X58XXXD Exposure to other specified factors, subsequent encounter: Secondary | ICD-10-CM | POA: Insufficient documentation

## 2017-02-06 DIAGNOSIS — S3210XD Unspecified fracture of sacrum, subsequent encounter for fracture with routine healing: Secondary | ICD-10-CM | POA: Insufficient documentation

## 2017-02-06 NOTE — Progress Notes (Signed)
Encounter Diagnosis  Name Primary?  . Other closed fracture of right pubis with routine healing, subsequent encounter Yes   Right hip pain   Consult follow-up fracture care right lateral compression pelvic fracture  Patient had local nursing home  Patient ambulates with a walker and standby assist  Today's x-ray shows healing appropriately right lateral compression type I pelvic fracture  Patient will continue weightbearing as tolerated will see [place] on an as-needed basis but continue gait training and be released when she meets appropriate criteria

## 2017-02-14 ENCOUNTER — Encounter: Payer: Self-pay | Admitting: Cardiology

## 2017-02-14 NOTE — Progress Notes (Signed)
Cardiology Office Note  Date: 02/15/2017   ID: Nakshatra, Klose Jan 22, 1930, MRN [id number]  PCP: [facility]  Evaluating Cardiologist: Rozann Lesches, MD   Chief Complaint  Patient presents with  . Tachycardia    History of Present Illness: EMSLEE LOPEZMARTINEZ is an 83 y.o. female that I am meeting for the first time in clinic today. She was evaluated by Mr. Reino Bellis just recently in July for sinus tachycardia. She has a history of dementia, failure to thrive, severe pulmonary hypertension, COPD, and a fall back in June with subsequent hip fracture. She continues to reside in a nursing care facility. She presents today with her niece for follow-up. She does not provide very much history but tells me that she has felt "okay." She does not endorse any chest pain. I see that she has had follow-up with a pulmonologist in [place] and has PRN follow up from there.  At patient's encounter with Mr. Reino Bellis, she was switched from Norvasc to diltiazem for more chronotropic effect. She was also taken off Mevacor and niacin. Her heart rate is better controlled today. Blood pressure also well controlled.  Echocardiogram from June is outlined below.  I reviewed recent lab work.  Past Medical History:  Diagnosis Date  . CKD (chronic kidney disease) stage 3, GFR 30-59 ml/min   . COPD (chronic obstructive pulmonary disease) ([place])   . Dementia   . Essential hypertension   . History of lung cancer   . Pulmonary hypertension ([place])     Past Surgical History:  Procedure Laterality Date  . ABDOMINAL HYSTERECTOMY    . LOBECTOMY      Current Outpatient Prescriptions  Medication Sig Dispense Refill  . aspirin EC 81 MG EC tablet Take 1 tablet (81 mg total) by mouth daily. 30 tablet 1  . cholecalciferol (VITAMIN D) 1000 units tablet Take 2,000 Units by mouth daily.    Marland Kitchen diltiazem (CARDIZEM CD) 120 MG 24 hr capsule Take 1 capsule (120 mg total) by mouth daily. 90  capsule 3  . donepezil (ARICEPT) 5 MG tablet Take 5 mg by mouth at bedtime.    . feeding supplement, ENSURE ENLIVE, (ENSURE ENLIVE) LIQD Take 237 mLs by mouth 2 (two) times daily between meals. 093 mL 12  . folic acid (FOLVITE) 1 MG tablet Take 1 tablet (1 mg total) by mouth daily. 30 tablet 1  . metoprolol tartrate (LOPRESSOR) 25 MG tablet Take 12.5 mg by mouth 2 (two) times daily.    . pantoprazole (PROTONIX) 40 MG tablet Take 40 mg by mouth daily.    . polyethylene glycol (MIRALAX / GLYCOLAX) packet Take 17 g by mouth daily.    . sennosides-docusate sodium (SENOKOT-S) 8.6-50 MG tablet Take 1 tablet by mouth daily.    . traMADol (ULTRAM) 50 MG tablet Take by mouth every 6 (six) hours as needed.     No current facility-administered medications for this visit.    Allergies:  Patient has no known allergies.   Social History: The patient  reports that she has never smoked. She has never used smokeless tobacco. She reports that she does not drink alcohol or use drugs.   ROS:  Please see the history of present illness. Otherwise, complete review of systems is positive for anorexia.  All other systems are reviewed and negative.   Physical Exam: VS:  BP 122/80   Pulse 99   Ht 5' (1.524 m)   Wt 76 lb (  34.5 kg)   SpO2 99%   BMI 14.84 kg/m , BMI Body mass index is 14.84 kg/m.  Wt Readings from Last 3 Encounters:  02/15/17 76 lb (34.5 kg)  01/24/17 92 lb (41.7 kg)  01/16/17 101 lb (45.8 kg)    General: Cachectic-appearing elderly woman seated in wheelchair wearing oxygen via nasal cannula. HEENT: Conjunctiva and lids normal, oropharynx clear with poor dentition. Neck: Supple, no elevated JVP, no thyromegaly. Lungs: No wheezing, diminished breath sounds at the bases, nonlabored breathing at rest. Cardiac: Regular rate and rhythm, no S3, no pericardial rub. Abdomen: Soft, nontender, bowel sounds present. Extremities: No pitting edema, distal pulses 2+. Skin: Warm and  dry. Musculoskeletal: Muscle wasting and kyphosis. Neuropsychiatric: Alert and oriented x1, affect grossly appropriate.  ECG: I personally reviewed the tracing from 01/24/2017 which showed probable sinus tachycardia with PACs, nonspecific ST-T changes.  Recent Labwork: 12/21/2016: B Natriuretic Peptide 113.0; TSH 0.912 12/22/2016: ALT 23; AST 27 01/16/2017: BUN 17; Creatinine, Ser 1.74; Hemoglobin 9.1; Platelets 529; Potassium 3.9; Sodium 140  July 2018: Hemoglobin 8.8, platelets 319, BUN 17, creatinine 1.43, potassium 3.8  Other Studies Reviewed Today:  Echocardiogram 12/22/2016: Study Conclusions  - Left ventricle: The cavity size was normal. Wall thickness was   increased in a pattern of mild LVH. Systolic function was   vigorous. The estimated ejection fraction was in the range of 70%   to 75%. Wall motion was normal; there were no regional wall   motion abnormalities. Doppler parameters are consistent with   abnormal left ventricular relaxation (grade 1 diastolic   dysfunction). - Aortic valve: Mildly calcified annulus. Mildly calcified   leaflets. Transvalvular velocity was increased, due to high   cardiac output. There was mild regurgitation. Mean gradient (S):   13 mm Hg. Peak gradient (S): 24 mm Hg. VTI ratio of LVOT to   aortic valve: 0.69. - Mitral valve: Calcified annulus. There was mild regurgitation. - Right atrium: Central venous pressure (est): 8 mm Hg. - Atrial septum: No defect or patent foramen ovale was identified. - Tricuspid valve: There was moderate regurgitation. - Pulmonary arteries: Systolic pressure was severely increased. PA   peak pressure: 73 mm Hg (S). - Pericardium, extracardiac: A small pericardial effusion was   identified posterior to the heart and circumferential to the   heart.  Impressions:  - Mild LVH with vigorous LVEF in the range of 70-75%. Grade 1   diastolic dysfunction. Mildly calcified mitral annulus with mild   mitral regurgitation.  Aortic valve is sclerotic with increased   transvalvular velocity, although this looks to be more a function   of vigorous contraction with high output rather than stenosis,   mean gradient is only 13 mmHg. There is also mild aortic   regurgitation. Moderate tricuspid regurgitation with evidence of   severe pulmonary hypertension, PASP 73 mmHg. Small   circumferential pericardial effusion.  Chest CT 01/16/2017: IMPRESSION: 1. No evidence of pulmonary embolus. 2. Moderate bilateral pleural effusions, with bibasilar interstitial prominence, concerning for pulmonary edema. 3. Diffuse fibrosis at the lung apices. Status post resection of the right upper lobe. No definite evidence of recurrent malignancy.  Assessment and Plan:  1. Sinus tachycardia, physiologically driven in light of comorbidities including oxygen-dependent COPD with pulmonary hypertension, anemia, and failure to thrive. She has tolerated switch from Norvasc to diltiazem with reasonable blood pressure control as well. LVEF is normal by recent echocardiogram. Do not anticipate further cardiac testing.  2. Essential hypertension, currently on diltiazem CD and  Lopressor. Keep follow-up with PCP.  Current medicines were reviewed with the patient today.  Disposition: Follow-up as needed.  Signed, Satira Sark, MD, [facility] 02/15/2017 2:32 PM    [place] at [facility] [address], [place], [place] [address] Phone: [phone]; Fax: [phone]

## 2017-02-15 ENCOUNTER — Encounter: Payer: Self-pay | Admitting: Cardiology

## 2017-02-15 ENCOUNTER — Ambulatory Visit (INDEPENDENT_AMBULATORY_CARE_PROVIDER_SITE_OTHER): Payer: Medicare Other | Admitting: Cardiology

## 2017-02-15 VITALS — BP 122/80 | HR 99 | Ht 60.0 in | Wt 76.0 lb

## 2017-02-15 DIAGNOSIS — I10 Essential (primary) hypertension: Secondary | ICD-10-CM | POA: Diagnosis not present

## 2017-02-15 DIAGNOSIS — R000 Tachycardia, unspecified: Secondary | ICD-10-CM

## 2017-02-15 NOTE — Patient Instructions (Signed)
Your physician recommends that you schedule a follow-up appointment in: as needed     Your physician recommends that you continue on your current medications as directed. Please refer to the Current Medication list given to you today.   Thank you for choosing [facility] !         

## 2018-02-20 IMAGING — DX DG PELVIS 1-2V
1 series · 1 of 1 positions shown · non-contrast
Comparison: CT scan of the pelvis December 22, 2016.

CLINICAL DATA: Status post recent pelvic fracture on December 21, 2016
as well as any removed pelvic fracture.

EXAM:
PELVIS - 1-2 VIEW

[pelvis ap]
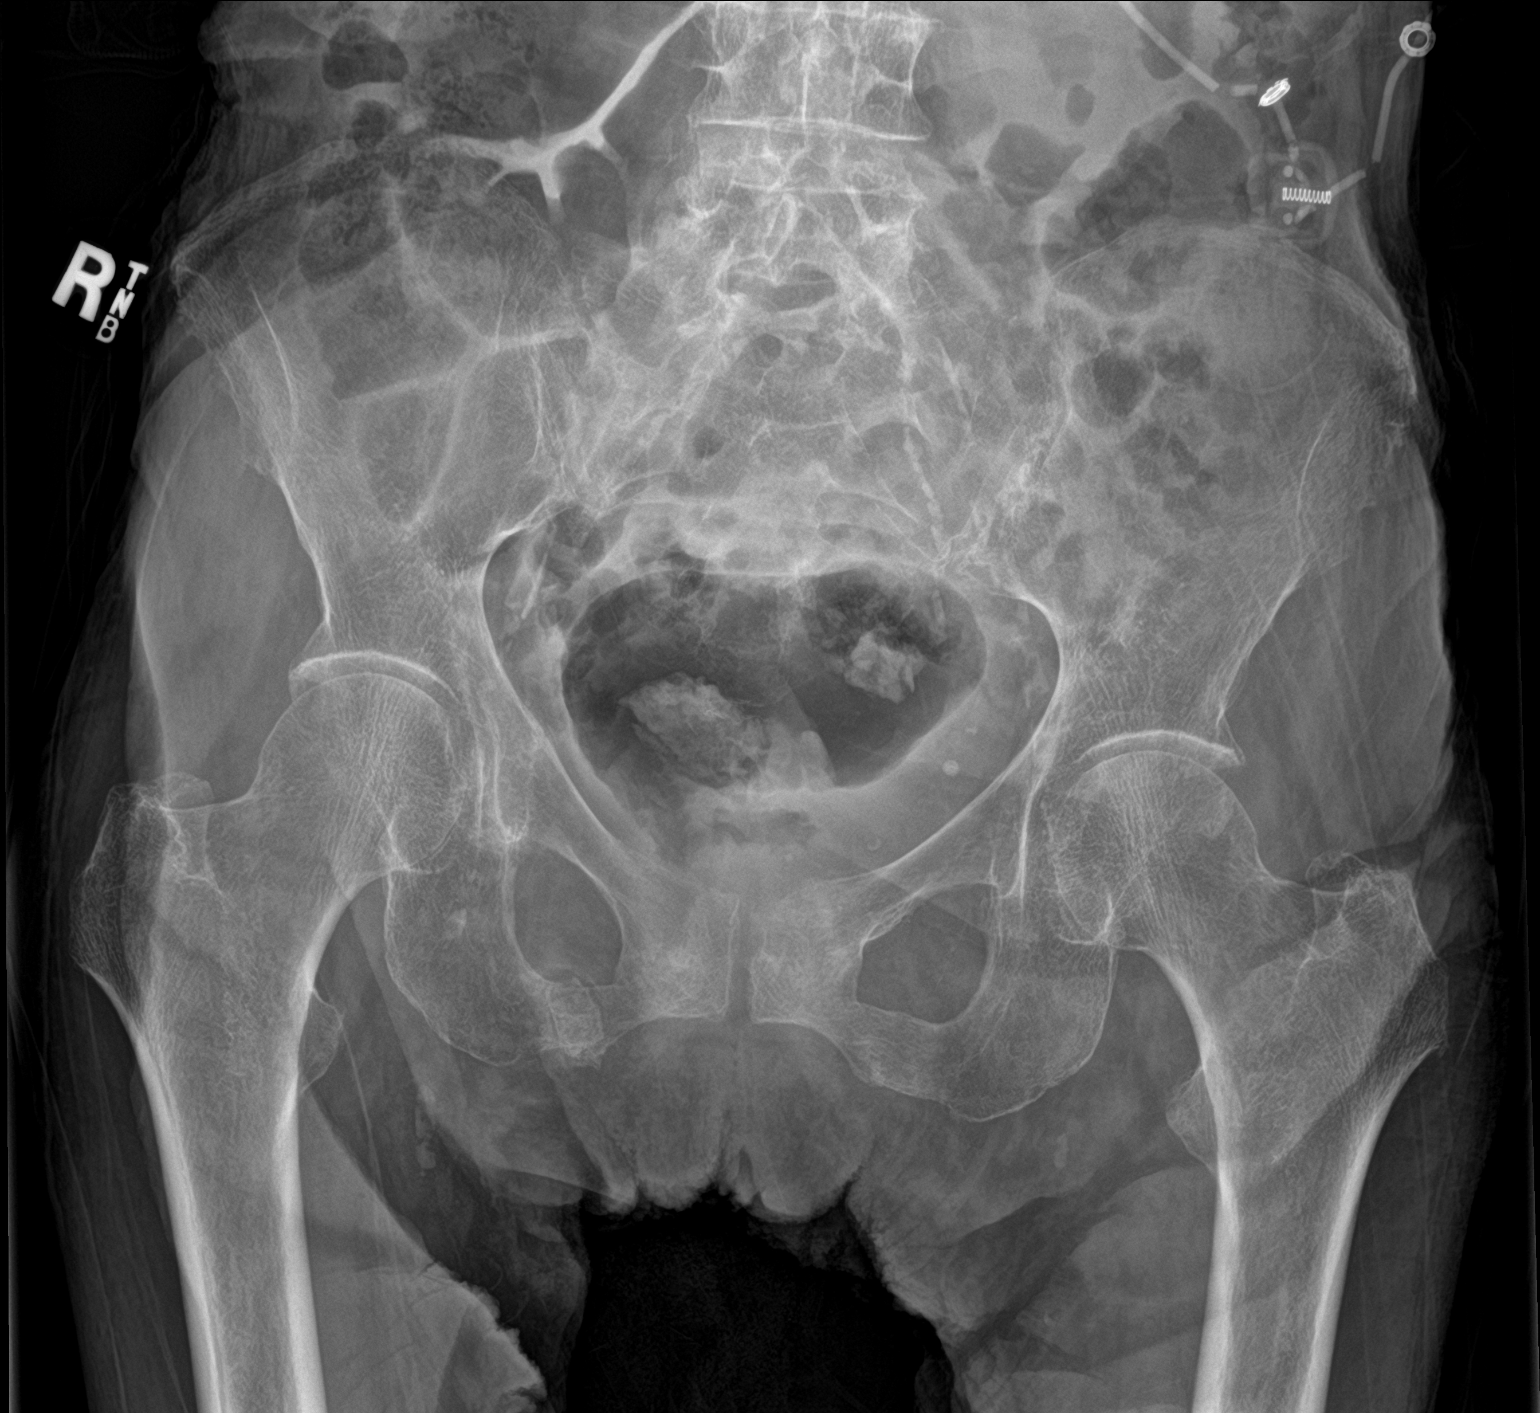

[1 of 1 positions shown; findings below may reference images not displayed]

FINDINGS: Known fractures of the right sacral ala are not clearly evident on
today's study. Fractures through the lateral aspect and
parasymphyseal portions of the right superior pubic ramus and of
themid body of the right inferior pubic ramus exhibit ongoing
healing with only faint visualization of the fracture lines. The
left hemipelvis is grossly intact. The bones are diffusely
osteopenic. The hips exhibit no acute abnormalities.
IMPRESSION: Ongoing healing of the pubic ramus fractures on the right. The right
sacral alar fracture is not clearly evident.

## 2018-02-27 ENCOUNTER — Inpatient Hospital Stay: Payer: Medicare Other | Admitting: Oncology

## 2020-01-01 ENCOUNTER — Telehealth: Payer: Self-pay | Admitting: Primary Care

## 2020-01-01 NOTE — Telephone Encounter (Signed)
Spoke with patient's niece, Wandra Mannan (primary caregiver), regarding Palliative services, all questions were answered and she was in agreement with this.  Niece also stated that patient's son was aware of this referral as well.  I have scheduled a Telephone Consult for 01/02/20 @ 1 PM

## 2020-01-02 ENCOUNTER — Other Ambulatory Visit: Payer: Medicare Other | Admitting: Primary Care

## 2020-01-02 ENCOUNTER — Other Ambulatory Visit: Payer: Self-pay

## 2020-01-02 DIAGNOSIS — E440 Moderate protein-calorie malnutrition: Secondary | ICD-10-CM

## 2020-01-02 DIAGNOSIS — Z515 Encounter for palliative care: Secondary | ICD-10-CM

## 2020-01-02 NOTE — Progress Notes (Signed)
[facility] Consult Note Telephone: [phone]  Fax: [phone]  PATIENT NAME: Kimberly Vincent [address] [phone] (home)  DOB: [birth year] MRN: [id number]  PRIMARY CARE PROVIDER:    [facility],  [place] [address] [phone]  REFERRING PROVIDER:   [facility] [place],  [place] [address] [phone]  RESPONSIBLE PARTY:   Extended Emergency Contact Information Primary Emergency Contact: Wandra Mannan Mobile Phone: [phone] Relation: Niece Secondary Emergency Contact: DAVIS,RENEE Address: [address]          [place], [place] [address] Home Phone: [phone] Relation: None  I met with patient and family by phone due to lack of technology. TELEHEALTH VISIT STATEMENT Due to the COVID-19 crisis, this visit was done via telemedicine from my office. It was initiated and consented to by this patient and/or family.  ASSESSMENT AND RECOMMENDATIONS:   1. Advance Care Planning/Goals of Care: Goals include to maximize quality of life and symptom management. Our advance care planning conversation included a discussion about:     The value and importance of advance care planning   Exploration of personal, cultural or spiritual beliefs that might influence medical decisions   Exploration of goals of care in the event of a sudden injury or illness   Identification of a healthcare agent   Discussion of an  advance directive document . We will discuss advance directives including code directives on our home visit in several weeks.   2. Symptom Management: Niece is caregiver, and states she is getting weaker after a fall. She did not have any fractures but has not returned to her baseline. She seeks someone to help her get Mrs. Swayze functional again.  I recommend a home health referral for PT and CNA. I will f/u with  home visit to assess hospice eligibility. I discussed with niece that PT could help pt return to any possible baseline but if she could not, we may need to discuss EOL and those care needs. We discussed the services and COPS of hospice. She voiced understanding.  3. Family /Caregiver/Community Supports: Lives with son, niece is caregiver. Has a second son but the one with whom she lives is Media planner.  Known to DSS ([place]) and care management at PCP.  4. Cognitive / Functional decline: Reported decline by family. Not able to transfer or walk.  5. Follow up Palliative Care Visit: Palliative care will continue to follow for goals of care clarification and symptom management. Return 2 weeks or prn.  I spent 30 minutes providing this consultation,  from 1300 to 1330. More than 50% of the time in this consultation was spent coordinating communication.   HISTORY OF PRESENT ILLNESS:  Kimberly Vincent is a 86 y.o. year old female with multiple medical problems including falls dementia, poor intake. Palliative Care was asked to follow this patient by consultation request of [facility]* to help address advance care planning and goals of care. This is the initial visit.  CODE STATUS: TBD  PPS: 30%  HOSPICE ELIGIBILITY/DIAGNOSIS: TBD  PAST MEDICAL HISTORY:  Past Medical History:  Diagnosis Date  . CKD (chronic kidney disease) stage 3, GFR 30-59 ml/min   . COPD (chronic obstructive pulmonary disease) ([place])   . Dementia   . Essential hypertension   . History of lung cancer   . Pulmonary hypertension ([place])     SOCIAL  HX:  Social History   Tobacco Use  . Smoking status: Never Smoker  . Smokeless tobacco: Never Used  Substance Use Topics  . Alcohol use: No    ALLERGIES: No Known Allergies   PERTINENT MEDICATIONS:  Outpatient Encounter Medications as of 01/02/2020  Medication Sig  . aspirin EC 81 MG EC tablet Take 1 tablet (81 mg total) by mouth daily.  . cholecalciferol (VITAMIN  D) 1000 units tablet Take 2,000 Units by mouth daily.  Marland Kitchen diltiazem (CARDIZEM CD) 120 MG 24 hr capsule Take 1 capsule (120 mg total) by mouth daily.  Marland Kitchen donepezil (ARICEPT) 5 MG tablet Take 5 mg by mouth at bedtime.  . feeding supplement, ENSURE ENLIVE, (ENSURE ENLIVE) LIQD Take 237 mLs by mouth 2 (two) times daily between meals.  . folic acid (FOLVITE) 1 MG tablet Take 1 tablet (1 mg total) by mouth daily.  . metoprolol tartrate (LOPRESSOR) 25 MG tablet Take 12.5 mg by mouth 2 (two) times daily.  . pantoprazole (PROTONIX) 40 MG tablet Take 40 mg by mouth daily.  . polyethylene glycol (MIRALAX / GLYCOLAX) packet Take 17 g by mouth daily.  . sennosides-docusate sodium (SENOKOT-S) 8.6-50 MG tablet Take 1 tablet by mouth daily.  . traMADol (ULTRAM) 50 MG tablet Take by mouth every 6 (six) hours as needed.   No facility-administered encounter medications on file as of 01/02/2020.    PHYSICAL EXAM / ROS:   deferred  Jason Coop, NP [facility]

## 2020-01-07 ENCOUNTER — Other Ambulatory Visit: Payer: Medicare Other | Admitting: Primary Care

## 2020-01-07 ENCOUNTER — Other Ambulatory Visit: Payer: Self-pay

## 2020-01-07 DIAGNOSIS — Z515 Encounter for palliative care: Secondary | ICD-10-CM

## 2020-01-07 DIAGNOSIS — E440 Moderate protein-calorie malnutrition: Secondary | ICD-10-CM

## 2020-01-07 NOTE — Progress Notes (Signed)
[facility] Consult Note Telephone: [phone]  Fax: [phone]  PATIENT NAME: [place] [address] [phone] (home)  DOB: 23-Mar-1930 MRN: [id number]  PRIMARY CARE PROVIDER:    [facility] [place],  [place] [address] [phone]  REFERRING PROVIDER:   [facility] [place],  [place] [address] [phone]  RESPONSIBLE PARTY:   Extended Emergency Contact Information Primary Emergency Contact: Wandra Mannan Mobile Phone: [phone] Relation: Niece Secondary Emergency Contact: DAVIS,RENEE Address: [address]          [place], [place] [address] Home Phone: [phone] Relation: None  I met with Kimberly Vincent and her niece in her home. She lives with her son who works during the day and her niece is her CAP caregiver. During our interview she dozed on and off.   ASSESSMENT AND RECOMMENDATIONS:   1. Advance Care Planning/Goals of Care: Goals include to maximize quality of life and symptom management. Our advance care planning conversation included a discussion about:     The value and importance of advance care planning   Experiences with loved ones who have been seriously ill or have died   Exploration of personal, cultural or spiritual beliefs that might influence medical decisions   Exploration of goals of care in the event of a sudden injury or illness   Identification and preparation of a healthcare agent   Review of an  advance directive document. We discuss the MOST form even though her decisions are made by her son who is her power of attorney. I informed the niece RE  the various questions found in the most form regarding advance care directive for medical care as well as quality of life issues. In this context we also discussed progression stages and progression of dementia. Her niece does recall that there  were signs probably for the last 4 to 6 years of her dementia. Her fast score is now 7B to C. NP will follow up in 4 to 6 weeks. Son is available after he gets off work, between 19 and 5 PM.   2. Symptom Management:   Mobility: Mrs. Brinkley had a fall from which she is still recuperating. We discussed her decline in mobility and the fact that this would not spontaneously return. I encouraged her caregiver to develop a home exercise program of getting up and taking a few steps,  and standing with standby assistance,  in order for her to regain some mobility.   Dementia: We discussed her disease process of dementia and the natural decline. I gave her the resources of the Alzheimer's foundation website and we discussed some of her behavior issues. It would appear she does have some agitation especially at night and insomnia. I would recommend 12 to 25 mg of quetiapine at HS. I also recommend starting Zoloft. Both can be compounded into a suspension if needed.   We discussed wandering as patient has gotten up and left the house but only as far as the yard. This is not been recent but we did discuss putting a chime on the door so that caregivers would know patient is  risk for elopement [place] We discussed aspects of dementia such as what to say when the patient asks for her siblings (deceased)  or to go home. During the interview she stated she wanted to go home. I  explain to her caregiver that it was important to get the idea of what she meant rather than to correct her statements. I suggested that she reassure the patient by telling her that she was with her family who loved her or to share a favorite memory about her siblings.   Nutrition: We discussed nutrition and intake. She is taking mostly ensure  and water. 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    3. Family /Caregiver/Community Supports: lives with family in son's home. Directed to Department on Aging for possible ramp.  4. Cognitive / Functional decline: FAST 7B-c. Nearly bedbound, can sit up for short duration. Dependent in most adls, iadls.  5. Follow up Palliative Care Visit: Palliative care will continue to follow for goals of care clarification and symptom management. Return 4-6 weeks or prn.  I spent 60 minutes providing this consultation,  from 1300 to 1400. More than 50% of the time in this consultation was spent coordinating communication.   HISTORY OF PRESENT ILLNESS:  Kimberly Vincent is a 86 y.o. year old female with multiple medical problems including Dementia, falls, skin breakdown. Palliative Care was asked to follow this patient by consultation request of [facility]* to help address advance care planning and goals of care. This is a follow up visit.  CODE STATUS: TBD  PPS: 30%  HOSPICE ELIGIBILITY/DIAGNOSIS: TBD/ DEMENTIA  PAST MEDICAL HISTORY:  Past Medical History:  Diagnosis Date  . CKD (chronic kidney disease) stage 3, GFR 30-59 ml/min   . COPD (chronic obstructive pulmonary disease) ([place])   . Dementia   . Essential hypertension   . History of lung cancer   . Pulmonary hypertension ([place])     SOCIAL HX:  Social History   Tobacco Use  . Smoking status: Never Smoker  . Smokeless tobacco: Never Used  Substance Use Topics  . Alcohol use: No    ALLERGIES: No Known Allergies   PERTINENT MEDICATIONS:  Outpatient Encounter Medications as of 01/07/2020  Medication Sig  . aspirin EC 81 MG EC tablet Take 1 tablet (81 mg total) by mouth daily.  . cholecalciferol (VITAMIN D) 1000 units tablet Take 2,000 Units by mouth daily.  Marland Kitchen diltiazem (CARDIZEM CD) 120 MG 24 hr capsule Take 1 capsule (120 mg total) by mouth daily.  Marland Kitchen donepezil (ARICEPT) 5 MG tablet Take 5 mg by mouth at bedtime.  .  feeding supplement, ENSURE ENLIVE, (ENSURE ENLIVE) LIQD Take 237 mLs by mouth 2 (two) times daily between meals.  . folic acid (FOLVITE) 1 MG tablet Take 1 tablet (1 mg total) by mouth daily.  . metoprolol tartrate (LOPRESSOR) 25 MG tablet Take 12.5 mg by mouth 2 (two) times daily.  . pantoprazole (PROTONIX) 40 MG tablet Take 40 mg by mouth daily.  . polyethylene glycol (MIRALAX / GLYCOLAX) packet Take 17 g by mouth daily.  . sennosides-docusate sodium (SENOKOT-S) 8.6-50 MG tablet Take 1 tablet by mouth daily.  . traMADol (ULTRAM) 50 MG tablet Take by mouth every 6 (six) hours as needed.   No facility-administered encounter medications on file as of 01/07/2020.    PHYSICAL EXAM / ROS:   Current and past weights:  Unavailable, subjective loss General: NAD, frail appearing, thin Cardiovascular: no chest pain reported, no edema  Pulmonary: no cough, no increased  SOB, room air Abdomen: appetite fair, endorses occ  constipation, incontinent of bowel GU: denies dysuria, incontinent of urine MSK:  + joint and ROM abnormalities, non ambulatory, non wt bearing Skin: caregiver endorses stage 1 on sacrum, bil heels. Neurological: Weakness,  FAST 7b-c, denies pain  Jason Coop, NP [facility]  COVID-19 PATIENT SCREENING TOOL  Person answering questions: __________family ______ _____   1.  Is the patient or any family member in the home showing any signs or symptoms regarding respiratory infection?               Person with Symptom- __________NA_________________  a. Fever                                                                          Yes___ No___          ___________________  b. Shortness of breath                                                    Yes___ No___          ___________________ c. Cough/congestion                                       Yes___  No___         ___________________ d. Body aches/pains                                                         Yes___ No___         ____________________ e. Gastrointestinal symptoms (diarrhea, nausea)           Yes___ No___        ____________________  2. Within the past 14 days, has anyone living in the home had any contact with someone with or under investigation for COVID-19?    Yes___ No_X_   Person __________________

## 2020-03-01 ENCOUNTER — Other Ambulatory Visit: Payer: Medicare Other | Admitting: Nurse Practitioner

## 2020-03-01 ENCOUNTER — Other Ambulatory Visit: Payer: Self-pay

## 2020-03-01 ENCOUNTER — Encounter: Payer: Self-pay | Admitting: Nurse Practitioner

## 2020-03-01 DIAGNOSIS — Z515 Encounter for palliative care: Secondary | ICD-10-CM

## 2020-03-01 DIAGNOSIS — I272 Pulmonary hypertension, unspecified: Secondary | ICD-10-CM

## 2020-03-01 NOTE — Progress Notes (Signed)
[facility] Consult Note Telephone: [phone]  Fax: [phone]  PATIENT NAME: Kimberly Vincent DOB: Dec 14, 1929 MRN: [id number]  PRIMARY CARE PROVIDER:   [facility]  REFERRING PROVIDER:  [facility] [place],  [place] [address]  RESPONSIBLE PARTY:    RECOMMENDATIONS and PLAN:  1. ACP: Made DNR; MOST form completed; placed in Vynca; Limited interventions, no feedings tubes, wishes are for antibiotic, ivf;   Left arm 5 inches Left leg 9 inches  2. Palliative care encounter; Palliative medicine team will continue to support patient, patient's family, and medical team. Visit consisted of counseling and education dealing with the complex and emotionally intense issues of symptom management and palliative care in the setting of serious and potentially life-threatening illness  3. F/u pending Hospice Physician review  I spent 90 minutes providing this consultation,  from 2:00pm to 3:30pm. More than 50% of the time in this consultation was spent coordinating communication.   HISTORY OF PRESENT ILLNESS:  Kimberly Vincent is a 86 y.o. year old female with multiple medical problems including Pulmonology HTN, COPD, CKD, dementia, hx lung cancer s/p chemo/radiation; colon cancer s/p sgy, HTN. In-person palliative care follow-up visit for Kimberly Vincent. I arrived at Kimberly Vincent, Kimberly Vincent; caregiver and Son, Kimberly Vincent were present. We talked about purpose of palliative care visit. We talked about how Kimberly Vincent has been feeling. Kimberly Vincent endorses it is her 89th birthday today. We talked about what it feels like to be 89. We talked about symptoms of pain which she does have some tenderness to the interior part of a right angle where bruising is noted. We talked about no noted injury though Kimberly Vincent endorses that she did find her on the floor with things around her the other  morning. Kimberly Vincent endorses that does seem to be healing and not as painful as it prevents only has been. We talked about Kimberly Vincent mobility as she does walk sometimes with her walker on her own when she gets it in her head she wants to go somewhere. Kimberly Vincent most of the time require assistance to get to a standing position and assistance with ambulating. Kimberly Vincent is total ADL care bathing and dressing with incontinence. Kimberly Vincent does require assistance with feeding and appetite remains declined. Kimberly Vincent endorses Kimberly Vincent does continue to drink nutritional supplements as Ensure several times a day. We talked about Kimberly Vincent cognition with chronic disease progression of dementia. Kimberly Vincent was able to answer basic questions so had difficulty with more recent memory. 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 Kimberly Vincent talked about her two sons and Kimberly Vincent. We talked about medical goals of care. We talked about MOST Kimberly Vincent endorses she has not approached Kimberly Vincent with that form as of yet. Asked if it was okay to further discuss and Kimberly Vincent in agreement. Kimberly Vincent and I sat down to talk about medical goals of care. We talked about aggressive versus conservative versus comfort care. We talked about wishes for no CPR, no intubation, no feeding tube but wishes are for IV fluids, antibiotics is needed. MOST form was completed and DNR form Kimberly Vincent, placed in vynca/epic. We talked about other medical goals of care including progression with chronic disease, protein calorie malnutrition noted in muscle wasting of her arms and legs in addition to dementia, natural progression of aging. We talked about hospice  benefit under the Medicare program and what it would offer. We talked about having Hospice Physicians review case to see about eligibility. Kimberly Vincent in agreement and will forward case for review. We talked about role of palliative care and plan of care. We talked about follow up  palliative care visit will have Hospice Physicians review for eligibility and then contact Kimberly Vincent about eligibility. Discussed that if Kimberly Grondahl eligible and their wishes are for Hospice then will not reschedule palliative care follow-up visit. Discussed that if not eligible or wishes are not to receive Hospice will schedule follow-up palliative care visit. Kimberly Vincent and Kimberly Vincent in agreement. Therapeutic listening and emotional support provided. Contact information. Questions answered to satisfaction.  Palliative Care was asked to help to continue to address goals of care.   CODE STATUS: DNR  PPS: 40% HOSPICE ELIGIBILITY/DIAGNOSIS: TBD  PAST MEDICAL HISTORY:  Past Medical History:  Diagnosis Date  . CKD (chronic kidney disease) stage 3, GFR 30-59 ml/min   . COPD (chronic obstructive pulmonary disease) ([place])   . Dementia ([place])   . Essential hypertension   . History of lung cancer   . Pulmonary hypertension ([place])     SOCIAL HX:  Social History   Tobacco Use  . Smoking status: Never Smoker  . Smokeless tobacco: Never Used  Substance Use Topics  . Alcohol use: No    ALLERGIES: No Known Allergies   PERTINENT MEDICATIONS:  Outpatient Encounter Medications as of 03/01/2020  Medication Sig  . aspirin EC 81 MG EC tablet Take 1 tablet (81 mg total) by mouth daily.  . cholecalciferol (VITAMIN D) 1000 units tablet Take 2,000 Units by mouth daily.  Marland Kitchen diltiazem (CARDIZEM CD) 120 MG 24 hr capsule Take 1 capsule (120 mg total) by mouth daily.  Marland Kitchen donepezil (ARICEPT) 5 MG tablet Take 5 mg by mouth at bedtime.  . feeding supplement, ENSURE ENLIVE, (ENSURE ENLIVE) LIQD Take 237 mLs by mouth 2 (two) times daily between meals.  . folic acid (FOLVITE) 1 MG tablet Take 1 tablet (1 mg total) by mouth daily.  . metoprolol tartrate (LOPRESSOR) 25 MG tablet Take 12.5 mg by mouth 2 (two) times daily.  . pantoprazole (PROTONIX) 40 MG tablet Take 40 mg by mouth daily.  . polyethylene glycol (MIRALAX /  GLYCOLAX) packet Take 17 g by mouth daily.  . sennosides-docusate sodium (SENOKOT-S) 8.6-50 MG tablet Take 1 tablet by mouth daily.  . traMADol (ULTRAM) 50 MG tablet Take by mouth every 6 (six) hours as needed.   No facility-administered encounter medications on file as of 03/01/2020.    PHYSICAL EXAM:   General: NAD, frail appearing, thin, cachetic, pleasant female Cardiovascular: regular rate and rhythm Pulmonary: clear ant fields Extremities: muscle wasting; atrophy Neurological: unsteady gait, walks with walker  Angalina Ante Ihor Gully, NP

## 2020-03-03 ENCOUNTER — Telehealth: Payer: Self-pay

## 2020-03-03 NOTE — Telephone Encounter (Signed)
RN call to [facility]  To review palliative care visits from NP.

## 2020-04-07 ENCOUNTER — Telehealth: Payer: Self-pay | Admitting: Nurse Practitioner

## 2020-04-07 NOTE — Telephone Encounter (Signed)
I called Kimberly Vincent to confirm Kimberly Vincent appointment tomorrow for f/u PC. Kimberly Vincent endorses Kimberly Vincent is doing well, the 2 boys in the home are sick and best plan to reschedule visit. Rescheduled. Contact information

## 2020-04-08 ENCOUNTER — Other Ambulatory Visit: Payer: Medicare Other | Admitting: Nurse Practitioner

## 2020-04-08 ENCOUNTER — Other Ambulatory Visit: Payer: Self-pay

## 2020-05-17 ENCOUNTER — Other Ambulatory Visit: Payer: Self-pay

## 2020-05-17 ENCOUNTER — Encounter: Payer: Self-pay | Admitting: Nurse Practitioner

## 2020-05-17 ENCOUNTER — Other Ambulatory Visit: Payer: Medicare Other | Admitting: Nurse Practitioner

## 2020-05-17 DIAGNOSIS — Z515 Encounter for palliative care: Secondary | ICD-10-CM

## 2020-05-17 DIAGNOSIS — I272 Pulmonary hypertension, unspecified: Secondary | ICD-10-CM

## 2020-05-17 NOTE — Progress Notes (Signed)
[facility] Consult Note Telephone: [phone]  Fax: [phone]  PATIENT NAME: Kimberly Vincent DOB: [birth year] MRN: [id number]  PRIMARY CARE PROVIDER:   [facility]  REFERRING PROVIDER:  The [place],  [place] [address]  RESPONSIBLE PARTY:    RECOMMENDATIONS and PLAN: 1.ACP: Made DNR; MOST form in vynca Left arm 6 inches Left leg 9 inches  2.Palliative care encounter; Palliative medicine team will continue to support patient, patient's family, and medical team. Visit consisted of counseling and education dealing with the complex and emotionally intense issues of symptom management and palliative care in the setting of serious and potentially life-threatening illness  3. F/u 4 weeks for ongoing discussions goc, monitoring weights, appetite  I spent 65 minutes providing this consultation,  from 1:45pm to 2:50pm. More than 50% of the time in this consultation was spent coordinating communication.   HISTORY OF PRESENT ILLNESS:  Kimberly Vincent is a 86 y.o. year old female with multiple medical problems including Pulmonology HTN, COPD, CKD, dementia, hx lung cancer s/p chemo/radiation; colon cancer s/p sgy, HTN.  Follow up palliative care visit for Kimberly Vincent for which she does continue to reside at home with 24-hour caregivers. Her niece Melissa cares for her during the day and Vincent Aaron Edelman in the evening. Kimberly. Vincent is able to get out of bed with assistance and ambulate a few steps to the bathroom. Kimberly. Vincent does require assistance with adl's bathing and dressing. Kimberly. Vincent does require assistance with feeding and appetite remains poor. Melissa endorses Kimberly Vincent only has been drinking Ensure and has been declining food. Melissa endorses Kimberly Vincent has been sleeping more during the day, napping. Also behaviors have become a little worse. Kimberly. Leafy Vincent does become irritable at  times making it difficult to perform adl's. At present Kimberly Vincent is lying in bed, appears thin, debilitated, confused. Kimberly. Vincent see does make eye contact and answer simple questions though very limited. Kimberly. Vincent appears comfortable. Kimberly Vincent was cooperative with assessment. Kimberly Sciara, Kimberly. Vincent neice and I talked separately. We talked about overall decline in progression and dementia. We talked about chronic disease progression of dementia. We reviewed medical goals of care with focus on comfort. We talked about option of Hospice under Medicare benefit as she has been eligible since last Palliative visit per Hospice Physicians. We talked about what Hospice Services would be offered. Melissa and I talked about different scenarios of Hospice. Kimberly. Vincent would be able to remain in her home. Melissa endorses she will further discuss about Hospice Services with Aaron Edelman, Kimberly. Pha Vincent and family. Melissa endorses she feels like at this time Kimberly. Vincent not quite ready for hospice but maybe soon in the future. We talked about the supportive role hospice does provide to patients and families. We talked about quality of life. We talked about role of palliative care and plan of care. We talked about follow-up visit in 4 weeks. Melissa in agreement, scheduled. Therapeutic  listening, emotional support provided. Contact information. Questions answered the satisfaction.  Palliative Care was asked to help to continue to address goals of care.   CODE STATUS: DNR  PPS: 40% HOSPICE ELIGIBILITY/DIAGNOSIS: TBD  PAST MEDICAL HISTORY:  Past Medical History:  Diagnosis Date   CKD (chronic kidney disease) stage 3, GFR 30-59 ml/min ([facility])    COPD (chronic obstructive pulmonary disease) ([facility])    Dementia ([place])    Essential  hypertension    History of lung cancer    Pulmonary hypertension ([facility])     SOCIAL HX:  Social History   Tobacco Use   Smoking status: Never Smoker   Smokeless tobacco: Never Used    Substance Use Topics   Alcohol use: No    ALLERGIES: No Known Allergies     PHYSICAL EXAM:   General: NAD, frail appearing, thin, debilitated, oriented to self, pleasant female Cardiovascular: regular rate and rhythm Pulmonary: clear ant fields Extremities: no edema, no joint deformities; muscle wasting Neurological: generalized weakness  Kimberly Vincent Ihor Gully, NP

## 2020-06-24 ENCOUNTER — Encounter: Payer: Self-pay | Admitting: Nurse Practitioner

## 2020-06-24 ENCOUNTER — Other Ambulatory Visit: Payer: Medicare Other | Admitting: Nurse Practitioner

## 2020-06-24 ENCOUNTER — Other Ambulatory Visit: Payer: Self-pay

## 2020-06-24 DIAGNOSIS — Z515 Encounter for palliative care: Secondary | ICD-10-CM

## 2020-06-24 DIAGNOSIS — I272 Pulmonary hypertension, unspecified: Secondary | ICD-10-CM

## 2020-06-24 NOTE — Progress Notes (Signed)
[facility] Consult Note Telephone: [phone]  Fax: [phone]  PATIENT NAME: Kimberly Vincent DOB: Dec 02, 1929 MRN: [id number]  PRIMARY CARE PROVIDER:   [facility]  REFERRING PROVIDER:  [facility],  [place] [address]  RESPONSIBLE PARTY:   Berna Spare niece [phone]  RECOMMENDATIONS and PLAN: 1.NID:POEU DNR; MOST form in vynca Left arm 6 inches Left leg 9 inches  2.Palliative care encounter; Palliative medicine team will continue to support patient, patient's family, and medical team. Visit consisted of counseling and education dealing with the complex and emotionally intense issues of symptom management and palliative care in the setting of serious and potentially life-threatening illness  3. F/u 4 weeks for ongoing discussions goc, monitoring weights  I spent 55 minutes providing this consultation,  from 2:45pm to 3:4-pm. More than 50% of the time in this consultation was spent coordinating communication.   HISTORY OF PRESENT ILLNESS:  Kimberly Vincent is a 86 y.o. year old female with multiple medical problems including Pulmonology HTN, COPD, CKD, dementia, hx lung cancer s/p chemo/radiation; colon cancer s/p sgy, HTN.In-person follow-up Palliative care visit with Kimberly Vincent and her niece Kimberly Vincent who is her primary caregiver. At present Kimberly Vincent is lying in bed, sleeping. Melissa and I talked about purpose of Palliative care visit in an agreement. We talked about how Kimberly Vincent  has been doing. Melissa endorses Kimberly Vincent seems to be doing okay for appetite continues to remain decline. No new weight-loss noted. We talked about Kimberly Vincent  functional ability as she is able to still take steps with a walker short distances to the bathroom. Melissa endorses Kimberly Vincent does require assistance with bath, dressing, toileting. Kimberly Vincent does feed herself with  assistance. We talked about no recent wounds, falls, infections, hospitalizations. We reviewed medical goals of care. We talked about option of Hospice Services but at this time Kimberly Vincent endorses they are doing okay and want to continue to wait with ongoing monitoring of chronic disease progression with Palliative care. We talked about role Palliative care and plan of care. Follow up Palliative care visit scheduled at 4 weeks if needed or sooner should Kimberly Vincent decline for ongoing monitoring for decline, weight loss and medical complex decision-making including Hospice Services at home. Therapeutic listening and emotional support provided. Contact information. Questions answer to satisfaction, appointment scheduled.    Palliative Care was asked to help to continue to address goals of care.   CODE STATUS: DNR  PPS: 40% HOSPICE ELIGIBILITY/DIAGNOSIS: TBD  PAST MEDICAL HISTORY:  Past Medical History:  Diagnosis Date  . CKD (chronic kidney disease) stage 3, GFR 30-59 ml/min (HCC)   . COPD (chronic obstructive pulmonary disease) ([facility])   . Dementia ([facility])   . Essential hypertension   . History of lung cancer   . Pulmonary hypertension ([facility])     SOCIAL HX:  Social History   Tobacco Use  . Smoking status: Never Smoker  . Smokeless tobacco: Never Used  Substance Use Topics  . Alcohol use: No    ALLERGIES: No Known Allergies   PERTINENT MEDICATIONS:  Outpatient Encounter Medications as of 06/24/2020  Medication Sig  . aspirin EC 81 MG EC tablet Take 1 tablet (81 mg total) by mouth daily.  . cholecalciferol (VITAMIN D) 1000 units tablet Take 2,000 Units by mouth daily.  Marland Kitchen diltiazem (CARDIZEM CD) 120 MG 24 hr capsule Take 1 capsule (120 mg  total) by mouth daily.  Marland Kitchen donepezil (ARICEPT) 5 MG tablet Take 5 mg by mouth at bedtime.  . feeding supplement, ENSURE ENLIVE, (ENSURE ENLIVE) LIQD Take 237 mLs by mouth 2 (two) times daily between meals.  . folic acid (FOLVITE) 1 MG tablet Take 1 tablet  (1 mg total) by mouth daily.  . metoprolol tartrate (LOPRESSOR) 25 MG tablet Take 12.5 mg by mouth 2 (two) times daily.  . pantoprazole (PROTONIX) 40 MG tablet Take 40 mg by mouth daily.  . polyethylene glycol (MIRALAX / GLYCOLAX) packet Take 17 g by mouth daily.  . sennosides-docusate sodium (SENOKOT-S) 8.6-50 MG tablet Take 1 tablet by mouth daily.  . traMADol (ULTRAM) 50 MG tablet Take by mouth every 6 (six) hours as needed.   No facility-administered encounter medications on file as of 06/24/2020.    PHYSICAL EXAM:   General: NAD, frail appearing, thin, debilitated, oriented to self pleasant female Cardiovascular: regular rate and rhythm Pulmonary: clear ant fields Extremities: no edema, no joint deformities; muscle wasting; atrophy Neurological: generalized weakness; walks with walker  Nevaeh Casillas Ihor Gully, NP

## 2020-08-10 ENCOUNTER — Encounter: Payer: Self-pay | Admitting: Nurse Practitioner

## 2020-08-10 ENCOUNTER — Other Ambulatory Visit: Payer: Medicare Other | Admitting: Nurse Practitioner

## 2020-08-10 ENCOUNTER — Other Ambulatory Visit: Payer: Self-pay

## 2020-08-10 DIAGNOSIS — I272 Pulmonary hypertension, unspecified: Secondary | ICD-10-CM

## 2020-08-10 DIAGNOSIS — Z515 Encounter for palliative care: Secondary | ICD-10-CM

## 2020-08-10 NOTE — Progress Notes (Signed)
[facility] Consult Note Telephone: [phone]  Fax: [phone]  PATIENT NAME: Kimberly Vincent DOB: 06/11/1930 MRN: [id number]  PRIMARY CARE PROVIDER:   [facility]  REFERRING PROVIDER:  [facility] [place],  Kimberly Vincent [address] RESPONSIBLE PARTY:   Kimberly Vincent niece [phone]  RECOMMENDATIONS and PLAN: 1.CLE:XNTZ DNR; MOSTform in vynca Left arm 5 inches Left leg 8 inches  Ms. Kimberly Vincent has lost an inch from left arm, left leg; PPS remains unchanged.  2.Palliative care encounter; Palliative medicine team will continue to support patient, patient's family, and medical team. Visit consisted of counseling and education dealing with the complex and emotionally intense issues of symptom management and palliative care in the setting of serious and potentially life-threatening illness  3. F/u4 weeks for ongoing discussions goc, monitoring weights  I spent 60 minutes providing this consultation,  from 1:15pm to 2:15pm. More than 50% of the time in this consultation was spent coordinating communication.   HISTORY OF PRESENT ILLNESS:  Kimberly Vincent is a 87 y.o. year old female with multiple medical problems including Pulmonology HTN, COPD, CKD, dementia, hx lung cancer s/p chemo/radiation; colon cancer s/p sgy, HTN.I called Melissa, Ms. Kimberly Vincent primary caregiver to confirm Palliative care follow-up visit in person and negative covid screening. Arrived at Ms. Kimberly Vincent home. Ms. Kimberly Vincent remains in bed. Ms. Kimberly Vincent does get up with assistance in her walker taking a few steps to her bathroom. Ms. Kimberly Vincent does require to be bathed, dressed and assisted with toilet. Ms. Kimberly Vincent does feed herself with assistance with appetite remaining declined. Kimberly Vincent endorses she does feel like Ms. Kimberly Vincent lost a little bit more weight but has not had to get smaller clothes as of yet. We talked about  nutrition. Ms. Kimberly Vincent does drink nutritional shakes during the day. We talked about something for pain and shortness of breath what she does not seem to exhibit. We talked about no recent wounds, falls, infections, hospitalizations. Kimberly Vincent endorses Ms. Kimberly Vincent does see an overall slow decline. Kimberly Vincent endorses it seems like it is becoming more difficult for Ms. Kimberly Vincent.  Ms. Kimberly Vincent and I talked about the things that she likes to do. Ms. Kimberly Vincent asked if she could have some books to read or paper to write. Kimberly Vincent endorses it is getting harder for her to see, read and write. Kimberly Vincent endorses Ms. Kimberly Vincent can not see used to write down recipes from cookbooks. Ms. Kimberly Vincent used to love to cook and was very good at it. We talked about foods that Ms. Kimberly Vincent can not see used to make like chocolate pies, chest pies. Kimberly Vincent endorses Ms. Kimberly Vincent could cook anything that she wanted. We talked about challenges as things change and once life. Medical goals of care reviewed. Kimberly Vincent endorses Ms. Kimberly Vincent has an upcoming appointment in March at [facility] which previously was Kimberly Vincent with a new provider. We talked next primary care visit checking her albumin,  total protein. Kimberly Vincent in agreement. Medical goals reviewed, remains DNR; We talked about role of Palliative care in plan of care. We talked about Hospice. At present time will continue to follow and monitor With next visit in 4 weeks if needed or sooner should she declined. Kimberly Vincent in agreement. Will contact Kimberly Vincent her son for update on Palliative care visit. Therapy listening and emotional support provided. Questions answered to satisfaction. Contact information.  Palliative Care was asked to help to continue to address  goals of care.   CODE STATUS: DNR  PPS: 40% HOSPICE ELIGIBILITY/DIAGNOSIS: TBD  PAST MEDICAL HISTORY:  Past Medical History:  Diagnosis Date  . CKD (chronic kidney disease) stage 3, GFR 30-59 ml/min (Kimberly Vincent)   . COPD (chronic  obstructive pulmonary disease) (Kimberly Vincent)   . Dementia ([facility])   . Essential hypertension   . History of lung cancer   . Pulmonary hypertension (Kimberly Vincent)     SOCIAL HX:  Social History   Tobacco Use  . Smoking status: Never Smoker  . Smokeless tobacco: Never Used  Substance Use Topics  . Alcohol use: No    ALLERGIES: No Known Allergies   PERTINENT MEDICATIONS:  Outpatient Encounter Medications as of 08/10/2020  Medication Sig  . aspirin EC 81 MG EC tablet Take 1 tablet (81 mg total) by mouth daily.  . cholecalciferol (VITAMIN D) 1000 units tablet Take 2,000 Units by mouth daily.  Marland Kitchen diltiazem (CARDIZEM CD) 120 MG 24 hr capsule Take 1 capsule (120 mg total) by mouth daily.  Marland Kitchen donepezil (ARICEPT) 5 MG tablet Take 5 mg by mouth at bedtime.  . feeding supplement, ENSURE ENLIVE, (ENSURE ENLIVE) LIQD Take 237 mLs by mouth 2 (two) times daily between meals.  . folic acid (FOLVITE) 1 MG tablet Take 1 tablet (1 mg total) by mouth daily.  . metoprolol tartrate (LOPRESSOR) 25 MG tablet Take 12.5 mg by mouth 2 (two) times daily.  . pantoprazole (PROTONIX) 40 MG tablet Take 40 mg by mouth daily.  . polyethylene glycol (MIRALAX / GLYCOLAX) packet Take 17 g by mouth daily.  . sennosides-docusate sodium (SENOKOT-S) 8.6-50 MG tablet Take 1 tablet by mouth daily.  . traMADol (ULTRAM) 50 MG tablet Take by mouth every 6 (six) hours as needed.   No facility-administered encounter medications on file as of 08/10/2020.    PHYSICAL EXAM:   General: NAD, frail appearing, thin, debilitated female Cardiovascular: regular rate and rhythm Pulmonary: clear ant fields Extremities: no edema, no joint deformities; muscle wasting;  Neurological: generalized weakness, walks with walker  Jeshua Ransford Ihor Gully, NP

## 2020-09-23 ENCOUNTER — Other Ambulatory Visit: Payer: Self-pay

## 2020-09-23 ENCOUNTER — Other Ambulatory Visit: Payer: Medicare Other | Admitting: Nurse Practitioner

## 2020-09-23 ENCOUNTER — Encounter: Payer: Self-pay | Admitting: Nurse Practitioner

## 2020-09-23 DIAGNOSIS — Z515 Encounter for palliative care: Secondary | ICD-10-CM

## 2020-09-23 DIAGNOSIS — I272 Pulmonary hypertension, unspecified: Secondary | ICD-10-CM

## 2020-09-23 NOTE — Progress Notes (Signed)
[facility] Consult Note Telephone: [phone]  Fax: [phone]  PATIENT NAME: Kimberly Vincent DOB: 1930/06/14 MRN: [id number]  PRIMARY CARE PROVIDER:   [facility]  REFERRING PROVIDER:  [facility] [place],  [place] [address]  RESPONSIBLE PARTY:Kimberly Vincent niece [phone]  Due to the COVID-19 crisis, this visit was done via telemedicine from my office and it was initiated and consent by this patient and or family  RECOMMENDATIONS and PLAN: 1.ACP: DNR; MOSTform in vynca  2.Palliative care encounter; Palliative medicine team will continue to support patient, patient's family, and medical team. Visit consisted of counseling and education dealing with the complex and emotionally intense issues of symptom management and palliative care in the setting of serious and potentially life-threatening illness  3. F/u4weeks for ongoing discussions goc, monitoring weights  4. Anorexia; continue to encourage ensure supplements, monitoring weights, measurements, oral hydration. Comfort feedings  I spent 35 minutes providing this consultation,  from 10:30am to 11:05am. More than 50% of the time in this consultation was spent coordinating communication.   HISTORY OF PRESENT ILLNESS:  Kimberly Vincent is a 87 y.o. year old female with multiple medical problems including Pulmonology HTN, COPD, CKD, dementia, hx lung cancer s/p chemo/radiation; colon cancer s/p sgy, HTN. I called Kimberly, Kimberly Vincent caregiver about palliative care visit. Change to telemedicine telephonic is video not available. We talked about how Kimberly Vincent has been doing. Kimberly endorses that she has been about the same. No new noted weight loss. Appetite is a few bites out of meal and then she drinks 3 to 4 ensures a day. Kimberly endorses she does try to keep her orally hydrated. No recent falls, wounds,   infections, hospitalizations. Kimberly Vincent does continue to sleep during the day napping and at night. We talked about symptoms of  pain which Kimberly Vincent does continue to experience inner lower legs at times intermittently. Continue to recommend current regiment Tylenol, may try Aspercreme topically or over-the-counter Voltaren gel. Kimberly and I talked about medical goals of care. We talked about Hospice Services. Kimberly endorses at this time she thinks they want to continue to wait and continue with palliative care. We talked about chronic disease progression. [facility]. Will request labs to see what albumin, total protein are in addition to renal function. We talked about follow up palliative care visit in 4 weeks if needed or sooner should she declined. Kimberly and agreement, appointment scheduled. Therapeutic listening, emotional support provided. Contact information. Questions answered is satisfaction.  Palliative Care was asked to help to continue to address goals of care.   CODE STATUS: DNR  PPS: 40% HOSPICE ELIGIBILITY/DIAGNOSIS: TBD  PAST MEDICAL HISTORY:  Past Medical History:  Diagnosis Date   CKD (chronic kidney disease) stage 3, GFR 30-59 ml/min (HCC)    COPD (chronic obstructive pulmonary disease) (HCC)    Dementia (HCC)    Essential hypertension    History of lung cancer    Pulmonary hypertension (HCC)     SOCIAL HX:  Social History   Tobacco Use   Smoking status: Never Smoker   Smokeless tobacco: Never Used  Substance Use Topics   Alcohol use: No    ALLERGIES: No Known Allergies   PERTINENT MEDICATIONS:  Outpatient Encounter Medications as of 09/23/2020  Medication Sig   aspirin EC 81 MG EC tablet Take 1 tablet (81 mg  total) by mouth daily.   cholecalciferol (VITAMIN D) 1000 units tablet Take 2,000 Units by mouth daily.   diltiazem (CARDIZEM CD) 120 MG 24 hr capsule Take 1 capsule (120 mg  total) by mouth daily.   donepezil (ARICEPT) 5 MG tablet Take 5 mg by mouth at bedtime.   feeding supplement, ENSURE ENLIVE, (ENSURE ENLIVE) LIQD Take 237 mLs by mouth 2 (two) times daily between meals.   folic acid (FOLVITE) 1 MG tablet Take 1 tablet (1 mg total) by mouth daily.   metoprolol tartrate (LOPRESSOR) 25 MG tablet Take 12.5 mg by mouth 2 (two) times daily.   pantoprazole (PROTONIX) 40 MG tablet Take 40 mg by mouth daily.   polyethylene glycol (MIRALAX / GLYCOLAX) packet Take 17 g by mouth daily.   sennosides-docusate sodium (SENOKOT-S) 8.6-50 MG tablet Take 1 tablet by mouth daily.   traMADol (ULTRAM) 50 MG tablet Take by mouth every 6 (six) hours as needed.   No facility-administered encounter medications on file as of 09/23/2020.    PHYSICAL EXAM:   deferred  Danthony Kendrix Ihor Gully, NP

## 2020-11-03 ENCOUNTER — Other Ambulatory Visit: Payer: Medicare Other | Admitting: Nurse Practitioner

## 2020-11-14 DEATH — deceased
